# Patient Record
Sex: Female | Born: 1960 | Hispanic: No | Marital: Married | Smoking: Never smoker
Health system: Southern US, Community
[De-identification: ages and names within clinical notes are randomized; demographics above are authoritative.]

## PROBLEM LIST (undated history)

## (undated) DIAGNOSIS — E785 Hyperlipidemia, unspecified: Secondary | ICD-10-CM

## (undated) DIAGNOSIS — E349 Endocrine disorder, unspecified: Secondary | ICD-10-CM

## (undated) HISTORY — PX: OTHER SURGICAL HISTORY: SHX169

## (undated) HISTORY — DX: Endocrine disorder, unspecified: E34.9

---

## 1999-03-18 ENCOUNTER — Ambulatory Visit (HOSPITAL_COMMUNITY): Admission: RE | Admit: 1999-03-18 | Discharge: 1999-03-18 | Payer: Self-pay | Admitting: Family Medicine

## 1999-03-18 ENCOUNTER — Encounter: Payer: Self-pay | Admitting: Family Medicine

## 1999-09-02 ENCOUNTER — Encounter (INDEPENDENT_AMBULATORY_CARE_PROVIDER_SITE_OTHER): Payer: Self-pay | Admitting: Specialist

## 1999-09-02 ENCOUNTER — Other Ambulatory Visit: Admission: RE | Admit: 1999-09-02 | Discharge: 1999-09-02 | Payer: Self-pay | Admitting: Gynecology

## 2000-02-06 ENCOUNTER — Inpatient Hospital Stay (HOSPITAL_COMMUNITY): Admission: AD | Admit: 2000-02-06 | Discharge: 2000-02-08 | Payer: Self-pay | Admitting: Gynecology

## 2000-02-09 ENCOUNTER — Encounter: Admission: RE | Admit: 2000-02-09 | Discharge: 2000-03-10 | Payer: Self-pay | Admitting: Gynecology

## 2000-04-27 ENCOUNTER — Encounter: Admission: RE | Admit: 2000-04-27 | Discharge: 2000-07-26 | Payer: Self-pay | Admitting: Gynecology

## 2001-05-20 ENCOUNTER — Other Ambulatory Visit: Admission: RE | Admit: 2001-05-20 | Discharge: 2001-05-20 | Payer: Self-pay | Admitting: Gynecology

## 2002-05-20 ENCOUNTER — Other Ambulatory Visit: Admission: RE | Admit: 2002-05-20 | Discharge: 2002-05-20 | Payer: Self-pay | Admitting: Gynecology

## 2003-05-25 ENCOUNTER — Other Ambulatory Visit: Admission: RE | Admit: 2003-05-25 | Discharge: 2003-05-25 | Payer: Self-pay | Admitting: Gynecology

## 2004-05-28 ENCOUNTER — Other Ambulatory Visit: Admission: RE | Admit: 2004-05-28 | Discharge: 2004-05-28 | Payer: Self-pay | Admitting: Gynecology

## 2005-06-13 ENCOUNTER — Other Ambulatory Visit: Admission: RE | Admit: 2005-06-13 | Discharge: 2005-06-13 | Payer: Self-pay | Admitting: Gynecology

## 2006-06-16 ENCOUNTER — Other Ambulatory Visit: Admission: RE | Admit: 2006-06-16 | Discharge: 2006-06-16 | Payer: Self-pay | Admitting: Gynecology

## 2007-06-21 ENCOUNTER — Other Ambulatory Visit: Admission: RE | Admit: 2007-06-21 | Discharge: 2007-06-21 | Payer: Self-pay | Admitting: Gynecology

## 2008-06-29 ENCOUNTER — Encounter: Admission: RE | Admit: 2008-06-29 | Discharge: 2008-06-29 | Payer: Self-pay | Admitting: Family Medicine

## 2008-07-03 ENCOUNTER — Encounter: Payer: Self-pay | Admitting: Gynecology

## 2008-07-03 ENCOUNTER — Other Ambulatory Visit: Admission: RE | Admit: 2008-07-03 | Discharge: 2008-07-03 | Payer: Self-pay | Admitting: Gynecology

## 2008-07-03 ENCOUNTER — Ambulatory Visit: Payer: Self-pay | Admitting: Gynecology

## 2009-07-04 ENCOUNTER — Ambulatory Visit: Payer: Self-pay | Admitting: Gynecology

## 2009-07-04 ENCOUNTER — Other Ambulatory Visit: Admission: RE | Admit: 2009-07-04 | Discharge: 2009-07-04 | Payer: Self-pay | Admitting: Gynecology

## 2010-03-31 HISTORY — PX: COLONOSCOPY: SHX174

## 2010-06-21 ENCOUNTER — Other Ambulatory Visit: Payer: Self-pay | Admitting: Gastroenterology

## 2010-07-09 ENCOUNTER — Other Ambulatory Visit (HOSPITAL_COMMUNITY)
Admission: RE | Admit: 2010-07-09 | Discharge: 2010-07-09 | Disposition: A | Discharge status: Home / Self Care | Payer: BLUE CROSS/BLUE SHIELD | Source: Ambulatory Visit | Attending: Gynecology | Admitting: Gynecology

## 2010-07-09 ENCOUNTER — Encounter: Payer: Self-pay | Admitting: Gynecology

## 2010-07-09 ENCOUNTER — Encounter (INDEPENDENT_AMBULATORY_CARE_PROVIDER_SITE_OTHER): Payer: BLUE CROSS/BLUE SHIELD | Admitting: Gynecology

## 2010-07-09 ENCOUNTER — Other Ambulatory Visit: Payer: Self-pay | Admitting: Gynecology

## 2010-07-09 DIAGNOSIS — Z124 Encounter for screening for malignant neoplasm of cervix: Secondary | ICD-10-CM | POA: Insufficient documentation

## 2010-07-09 DIAGNOSIS — Z30431 Encounter for routine checking of intrauterine contraceptive device: Secondary | ICD-10-CM

## 2010-07-09 DIAGNOSIS — Z833 Family history of diabetes mellitus: Secondary | ICD-10-CM

## 2010-07-09 DIAGNOSIS — Z01419 Encounter for gynecological examination (general) (routine) without abnormal findings: Secondary | ICD-10-CM

## 2010-07-09 DIAGNOSIS — Z13220 Encounter for screening for lipoid disorders: Secondary | ICD-10-CM

## 2010-08-16 NOTE — Discharge Summary (Signed)
[facility]  Patient:    Debbie Decker, Debbie Decker                          MRN: [id number] Adm. Date:  [id number] Disc. Date: [id number] Attending:  Tonye Royalty Dictator:   Antony Contras, [facility]                           Discharge Summary  DISCHARGE DIAGNOSES:          1. Intrauterine pregnancy, at 37 weeks.                               2. History of advanced maternal age.                               3. Positive group B streptococcus status.                               4. History of an endometrial polyp, which                                  was excised in the first trimester.  PROCEDURES:                   Low forceps assisted vaginal delivery of a viable infant over intact perineum with a second degree midline laceration.  HISTORY OF PRESENT ILLNESS:   The patient is a 50 year old, gravida 4, para 0-0-3-0 with an LMP of May 29, 1999, [facility] of February 27, 2000.  Prenatal risk factors include advanced maternal age, excision of endometrial polyp in the first trimester, and positive GBS status.  PRENATAL LABORATORY DATA:     Prenatal labs were as follows:  Blood type O positive.  Antibody screen negative.  RPR.  HBsAg nonreactive. Rubella-immune.  MSAFP and genetic amniocentesis normal.  GBS positive.  HOSPITAL COURSE AND TREATMENT:                    The patient was admitted at 37 weeks with spontaneous rupture of membranes and uterine contractions.  The cervix was 4 cm, 80%, -1 to 2 station.  Labor did progress to complete dilatation.  She did sustain some variable decelerations with pushing.  Delivery was accomplished per assistance with low forceps, Apgars 9 and 9, female infant, weighing 6 pounds 6 ounces over intact perineum with a second degree midline laceration.  Postoperative course:  She remained afebrile, had no difficulty voiding, was able to be discharged in stable condition on her second postpartum day.  CBC: Hematocrit  29.1, hemoglobin 10.4, WBC 13.5, platelets 196.  DISPOSITION:                  Followup in the office in six weeks.  Continue with prenatal vitamins and iron.  Motrin and Tylox for pain.  DD:  03/02/00 TD:  03/02/00 Job: [id number] XL/[id number]

## 2011-06-10 ENCOUNTER — Encounter: Payer: Self-pay | Admitting: Gynecology

## 2011-07-10 ENCOUNTER — Encounter: Payer: Self-pay | Admitting: Gynecology

## 2011-07-11 ENCOUNTER — Encounter: Payer: Self-pay | Admitting: Gynecology

## 2011-07-11 ENCOUNTER — Ambulatory Visit (INDEPENDENT_AMBULATORY_CARE_PROVIDER_SITE_OTHER): Payer: 59 | Admitting: Gynecology

## 2011-07-11 VITALS — BP 112/78 | Ht 59.75 in | Wt 106.0 lb

## 2011-07-11 DIAGNOSIS — Z30431 Encounter for routine checking of intrauterine contraceptive device: Secondary | ICD-10-CM

## 2011-07-11 DIAGNOSIS — Z01419 Encounter for gynecological examination (general) (routine) without abnormal findings: Secondary | ICD-10-CM

## 2011-07-11 NOTE — Patient Instructions (Signed)
Follow up for [facility] result. Otherwise follow up in one year for annual gynecologic exam

## 2011-07-11 NOTE — Progress Notes (Signed)
Debbie Decker [birth year] [id number]        51 y.o.  for annual exam.  Doing well no complaints.  Past medical history,surgical history, medications, allergies, family history and social history were all reviewed and documented in the EPIC chart. ROS:  Was performed and pertinent positives and negatives are included in the history.  Exam: Elane Fritz chaperone present Filed Vitals:   07/11/11 0925  BP: 112/78   General appearance  Normal Skin grossly normal Head/Neck normal with no cervical or supraclavicular adenopathy thyroid normal Lungs  clear Cardiac RR, without RMG Abdominal  soft, nontender, without masses, organomegaly or hernia Breasts  examined lying and sitting without masses, retractions, discharge or axillary adenopathy. Pelvic  Ext/BUS/vagina  normal   Cervix  normal  IUD string visualized  Uterus  retroverted, normal size, shape and contour, midline and mobile nontender   Adnexa  Without masses or tenderness    Anus and perineum  normal   Rectovaginal  normal sphincter tone without palpated masses or tenderness. External hemorrhoids noted.   Assessment/Plan:  51 y.o. female for annual exam.    1. Mirena IUD. Placed March 2009. Having light menses. No hot flushes night sweats or other symptoms. We'll check baseline FSH. 2. Mammogram. Patient had mammography in March 2013. She'll continue with annual mammography. SBE monthly reviewed. 3. Colonoscopy. Patient had her colonoscopy 2012. We'll follow up with her recommended screening interval. 4. Pap smear. No Pap smear was done today. Last Pap smear 2012. Patient has no history of abnormal Pap smears with multiple normal reports in her chart. We'll plan every 3 years screen per current screening guidelines. 5. Bone health. Increase calcium vitamin D. We'll plan DEXA in several years after transition through menopause. 6. Health maintenance. No blood work was done today as it was all recently done through her primary physician's  office. Assuming she continues well from a gynecologic standpoint she will see [place] in a year, sooner as needed    Dara Lords MD, 9:54 AM 07/11/2011

## 2011-07-12 LAB — FOLLICLE STIMULATING HORMONE: FSH: 58.3 m[IU]/mL

## 2011-07-14 ENCOUNTER — Encounter: Payer: Self-pay | Admitting: Gynecology

## 2012-06-24 ENCOUNTER — Encounter: Payer: Self-pay | Admitting: Gynecology

## 2012-07-20 ENCOUNTER — Encounter: Payer: Self-pay | Admitting: Gynecology

## 2012-07-20 ENCOUNTER — Ambulatory Visit (INDEPENDENT_AMBULATORY_CARE_PROVIDER_SITE_OTHER): Payer: 59 | Admitting: Gynecology

## 2012-07-20 VITALS — BP 124/74 | Ht 60.0 in | Wt 106.0 lb

## 2012-07-20 DIAGNOSIS — Z30431 Encounter for routine checking of intrauterine contraceptive device: Secondary | ICD-10-CM

## 2012-07-20 DIAGNOSIS — Z30432 Encounter for removal of intrauterine contraceptive device: Secondary | ICD-10-CM

## 2012-07-20 DIAGNOSIS — Z01419 Encounter for gynecological examination (general) (routine) without abnormal findings: Secondary | ICD-10-CM

## 2012-07-20 DIAGNOSIS — K649 Unspecified hemorrhoids: Secondary | ICD-10-CM

## 2012-07-20 NOTE — Progress Notes (Signed)
Debbie Decker 1961-01-07 [id number]        52 y.o.  [id number] for annual exam.  Doing well. Several issues noted below.  Past medical history,surgical history, medications, allergies, family history and social history were all reviewed and documented in the EPIC chart. ROS:  Was performed and pertinent positives and negatives are included in the history.  Exam: Kim assistant Filed Vitals:   07/20/12 1018  BP: 124/74  Height: 5' (1.524 m)  Weight: 106 lb (48.081 kg)   General appearance  Normal Skin grossly normal Head/Neck normal with no cervical or supraclavicular adenopathy thyroid normal Lungs  clear Cardiac RR, without RMG Abdominal  soft, nontender, without masses, organomegaly or hernia Breasts  examined lying and sitting without masses, retractions, discharge or axillary adenopathy. Pelvic  Ext/BUS/vagina  normal   Cervix  normal with IUD string visualized  Uterus  anteverted, normal size, shape and contour, midline and mobile nontender   Adnexa  Without masses or tenderness    Anus and perineum  normal   Rectovaginal  normal sphincter tone without palpated masses or tenderness. Old external hemorrhoids   Assessment/Plan:  52 y.o. [id number] female for annual exam.   1. Mirena IUD 05/2007. Due to be removed now. Is amenorrheic with occasional hot flashes. Had [facility] of 58 last year. Mirena IUD string visualized, grasped with a [place] forcep and her Mirena IUD was removed, shown to her and discarded. At this point she will keep symptoms/menstrual calendar. Assuming no further bleeding will monitor. Resumes vaginal bleeding then she'll represent for further evaluation. We'll keep menopausal symptom calendar and as long as acceptable then we'll follow. OTC soy-based product options reviewed as well as HRT. Patient will represent if her symptoms become unacceptable. The issue of contraception was also reviewed and options for barrier contraception now until we were assured she remains  amenorrheic was also discussed. 2. External hemorrhoids. Has had for a number of years. Not overly bothersome to the patient she'll continue to monitor. 3. Mammography 05/2012. Continue with annual mammography. SBE monthly reviewed. 4. Pap smear 2012. No Pap smear done today. No history of abnormal Pap smears previously. Plan repeat Pap smear next year a 3 year interval. 5. Colonoscopy 2012. Followup at their recommended interval. 6. Health maintenance. Patient's making an appointment to see her primary physician Dr. Tiburcio Pea. She does note some fatigue. Discussed that that may be related to the menopause but also needs to have her thyroid and hemoglobin checked. Offered today but she declined and said she can followup with him to do all of her routine blood work. Followup one year, sooner as needed.    Dara Lords MD, 10:37 AM 07/20/2012

## 2012-07-20 NOTE — Patient Instructions (Signed)
In any vaginal bleeding followup for evaluation. Followup if menopausal symptoms worsened and you want to discuss hormone replacement. Followup with Dr. Tiburcio Pea in reference to general health maintenance and your fatigue. Followup here in one year if you continue well.

## 2012-07-21 LAB — URINALYSIS W MICROSCOPIC + REFLEX CULTURE
Casts: NONE SEEN
Hgb urine dipstick: NEGATIVE
Leukocytes, UA: NEGATIVE
Nitrite: NEGATIVE
Specific Gravity, Urine: 1.016 (ref 1.005–1.030)
pH: 7.5 (ref 5.0–8.0)

## 2013-06-29 ENCOUNTER — Encounter: Payer: Self-pay | Admitting: Gynecology

## 2013-07-26 ENCOUNTER — Encounter: Payer: Self-pay | Admitting: Gynecology

## 2013-07-26 ENCOUNTER — Other Ambulatory Visit (HOSPITAL_COMMUNITY)
Admission: RE | Admit: 2013-07-26 | Discharge: 2013-07-26 | Disposition: A | Discharge status: Home / Self Care | Payer: 59 | Source: Ambulatory Visit | Attending: Gynecology | Admitting: Gynecology

## 2013-07-26 ENCOUNTER — Ambulatory Visit (INDEPENDENT_AMBULATORY_CARE_PROVIDER_SITE_OTHER): Payer: 59 | Admitting: Gynecology

## 2013-07-26 VITALS — BP 120/76 | Ht 60.0 in | Wt 105.0 lb

## 2013-07-26 DIAGNOSIS — Z1151 Encounter for screening for human papillomavirus (HPV): Secondary | ICD-10-CM | POA: Insufficient documentation

## 2013-07-26 DIAGNOSIS — Z01419 Encounter for gynecological examination (general) (routine) without abnormal findings: Secondary | ICD-10-CM | POA: Insufficient documentation

## 2013-07-26 DIAGNOSIS — N952 Postmenopausal atrophic vaginitis: Secondary | ICD-10-CM

## 2013-07-26 DIAGNOSIS — K649 Unspecified hemorrhoids: Secondary | ICD-10-CM

## 2013-07-26 NOTE — Patient Instructions (Signed)
Followup in one year, sooner if vaginal issues become a problem.  Health Maintenance, Female A healthy lifestyle and preventative care can promote health and wellness.  Maintain regular health, dental, and eye exams.  Eat a healthy diet. Foods like vegetables, fruits, whole grains, low-fat dairy products, and lean protein foods contain the nutrients you need without too many calories. Decrease your intake of foods high in solid fats, added sugars, and salt. Get information about a proper diet from your caregiver, if necessary.  Regular physical exercise is one of the most important things you can do for your health. Most adults should get at least 150 minutes of moderate-intensity exercise (any activity that increases your heart rate and causes you to sweat) each week. In addition, most adults need muscle-strengthening exercises on 2 or more days a week.   Maintain a healthy weight. The body mass index (BMI) is a screening tool to identify possible weight problems. It provides an estimate of body fat based on height and weight. Your caregiver can help determine your BMI, and can help you achieve or maintain a healthy weight. For adults 20 years and older:  A BMI below 18.5 is considered underweight.  A BMI of 18.5 to 24.9 is normal.  A BMI of 25 to 29.9 is considered overweight.  A BMI of 30 and above is considered obese.  Maintain normal blood lipids and cholesterol by exercising and minimizing your intake of saturated fat. Eat a balanced diet with plenty of fruits and vegetables. Blood tests for lipids and cholesterol should begin at age 29 and be repeated every 5 years. If your lipid or cholesterol levels are high, you are over 50, or you are a high risk for heart disease, you may need your cholesterol levels checked more frequently.Ongoing high lipid and cholesterol levels should be treated with medicines if diet and exercise are not effective.  If you smoke, find out from your caregiver  how to quit. If you do not use tobacco, do not start.  Lung cancer screening is recommended for adults aged 45 80 years who are at high risk for developing lung cancer because of a history of smoking. Yearly low-dose computed tomography (CT) is recommended for people who have at least a 30-pack-year history of smoking and are a current smoker or have quit within the past 15 years. A pack year of smoking is smoking an average of 1 pack of cigarettes a day for 1 year (for example: 1 pack a day for 30 years or 2 packs a day for 15 years). Yearly screening should continue until the smoker has stopped smoking for at least 15 years. Yearly screening should also be stopped for people who develop a health problem that would prevent them from having lung cancer treatment.  If you are pregnant, do not drink alcohol. If you are breastfeeding, be very cautious about drinking alcohol. If you are not pregnant and choose to drink alcohol, do not exceed 1 drink per day. One drink is considered to be 12 ounces (355 mL) of beer, 5 ounces (148 mL) of wine, or 1.5 ounces (44 mL) of liquor.  Avoid use of street drugs. Do not share needles with anyone. Ask for help if you need support or instructions about stopping the use of drugs.  High blood pressure causes heart disease and increases the risk of stroke. Blood pressure should be checked at least every 1 to 2 years. Ongoing high blood pressure should be treated with medicines, if weight  loss and exercise are not effective.  If you are 55 to 55 years old, ask your caregiver if you should take aspirin to prevent strokes.  Diabetes screening involves taking a blood sample to check your fasting blood sugar level. This should be done once every 3 years, after age 45, if you are within normal weight and without risk factors for diabetes. Testing should be considered at a younger age or be carried out more frequently if you are overweight and have at least 1 risk factor for  diabetes.  Breast cancer screening is essential preventative care for women. You should practice "breast self-awareness." This means understanding the normal appearance and feel of your breasts and may include breast self-examination. Any changes detected, no matter how small, should be reported to a caregiver. Women in their 20s and 30s should have a clinical breast exam (CBE) by a caregiver as part of a regular health exam every 1 to 3 years. After age 40, women should have a CBE every year. Starting at age 40, women should consider having a mammogram (breast X-ray) every year. Women who have a family history of breast cancer should talk to their caregiver about genetic screening. Women at a high risk of breast cancer should talk to their caregiver about having an MRI and a mammogram every year.  Breast cancer gene (BRCA)-related cancer risk assessment is recommended for women who have family members with BRCA-related cancers. BRCA-related cancers include breast, ovarian, tubal, and peritoneal cancers. Having family members with these cancers may be associated with an increased risk for harmful changes (mutations) in the breast cancer genes BRCA1 and BRCA2. Results of the assessment will determine the need for genetic counseling and BRCA1 and BRCA2 testing.  The Pap test is a screening test for cervical cancer. Women should have a Pap test starting at age 21. Between ages 21 and 29, Pap tests should be repeated every 2 years. Beginning at age 30, you should have a Pap test every 3 years as long as the past 3 Pap tests have been normal. If you had a hysterectomy for a problem that was not cancer or a condition that could lead to cancer, then you no longer need Pap tests. If you are between ages 65 and 70, and you have had normal Pap tests going back 10 years, you no longer need Pap tests. If you have had past treatment for cervical cancer or a condition that could lead to cancer, you need Pap tests and  screening for cancer for at least 20 years after your treatment. If Pap tests have been discontinued, risk factors (such as a new sexual partner) need to be reassessed to determine if screening should be resumed. Some women have medical problems that increase the chance of getting cervical cancer. In these cases, your caregiver may recommend more frequent screening and Pap tests.  The human papillomavirus (HPV) test is an additional test that may be used for cervical cancer screening. The HPV test looks for the virus that can cause the cell changes on the cervix. The cells collected during the Pap test can be tested for HPV. The HPV test could be used to screen women aged 30 years and older, and should be used in women of any age who have unclear Pap test results. After the age of 30, women should have HPV testing at the same frequency as a Pap test.  Colorectal cancer can be detected and often prevented. Most routine colorectal cancer screening begins at   the age of 50 and continues through age 75. However, your caregiver may recommend screening at an earlier age if you have risk factors for colon cancer. On a yearly basis, your caregiver may provide home test kits to check for hidden blood in the stool. Use of a small camera at the end of a tube, to directly examine the colon (sigmoidoscopy or colonoscopy), can detect the earliest forms of colorectal cancer. Talk to your caregiver about this at age 50, when routine screening begins. Direct examination of the colon should be repeated every 5 to 10 years through age 75, unless early forms of pre-cancerous polyps or small growths are found.  Hepatitis C blood testing is recommended for all people born from 1945 through 1965 and any individual with known risks for hepatitis C.  Practice safe sex. Use condoms and avoid high-risk sexual practices to reduce the spread of sexually transmitted infections (STIs). Sexually active women aged 25 and younger should be  checked for Chlamydia, which is a common sexually transmitted infection. Older women with new or multiple partners should also be tested for Chlamydia. Testing for other STIs is recommended if you are sexually active and at increased risk.  Osteoporosis is a disease in which the bones lose minerals and strength with aging. This can result in serious bone fractures. The risk of osteoporosis can be identified using a bone density scan. Women ages 65 and over and women at risk for fractures or osteoporosis should discuss screening with their caregivers. Ask your caregiver whether you should be taking a calcium supplement or vitamin D to reduce the rate of osteoporosis.  Menopause can be associated with physical symptoms and risks. Hormone replacement therapy is available to decrease symptoms and risks. You should talk to your caregiver about whether hormone replacement therapy is right for you.  Use sunscreen. Apply sunscreen liberally and repeatedly throughout the day. You should seek shade when your shadow is shorter than you. Protect yourself by wearing long sleeves, pants, a wide-brimmed hat, and sunglasses year round, whenever you are outdoors.  Notify your caregiver of new moles or changes in moles, especially if there is a change in shape or color. Also notify your caregiver if a mole is larger than the size of a pencil eraser.  Stay current with your immunizations. Document Released: 09/30/2010 Document Revised: 07/12/2012 Document Reviewed: 09/30/2010 [employer] Patient Information 2014 [employer].  

## 2013-07-26 NOTE — Progress Notes (Signed)
Debbie Decker 10/21/1960 [id number]        53 y.o.  [id number] for annual exam.  Several issues noted below.  Past medical history,surgical history, problem list, medications, allergies, family history and social history were all reviewed and documented as reviewed in the EPIC chart.  ROS:  12 system ROS performed with pertinent positives and negatives included in the history, assessment and plan.  Included Systems: General, HEENT, Neck, Cardiovascular, Pulmonary, Gastrointestinal, Genitourinary, Musculoskeletal, Dermatologic, Endocrine, Hematological, Neurologic, Psychiatric Additional significant findings : Vaginal dryness   Exam: Debbie Decker assistant Filed Vitals:   07/26/13 1555  BP: 120/76  Height: 5' (1.524 m)  Weight: 105 lb (47.628 kg)   General appearance:  Normal affect, orientation and appearance. Skin: Grossly normal HEENT: Without gross lesions.  No cervical or supraclavicular adenopathy. Thyroid normal.  Lungs:  Clear without wheezing, rales or rhonchi Cardiac: RR, without RMG Abdominal:  Soft, nontender, without masses, guarding, rebound, organomegaly or hernia Breasts:  Examined lying and sitting without masses, retractions, discharge or axillary adenopathy. Pelvic:  Ext/BUS/vagina with generalized atrophic changes  Cervix with atrophic changes  Uterus anteverted, normal size, shape and contour, midline and mobile nontender   Adnexa  Without masses or tenderness    Anus and perineum  Normal with large external hemorrhoids  Rectovaginal  Normal sphincter tone without palpated masses or tenderness.    Assessment/Plan:  53 y.o. [id number] female for annual exam.   1. Postmenopausal/atrophic genital changes. Patient remains amenorrheic since removing her IUD last year. Not having significant hot flashes or night sweats. He is having some vaginal dryness and dyspareunia. No vaginal bleeding. Options for management include OTC products, vaginal estrogen cream, Vagifem and Osphena  reviewed. The pros/cons, risks/benefits of these choices discussed. Issues of absorption and systemic risks also discussed. Patient not interested in trying any prescription medication. Prefers monitoring at present. Will call if she decides she wants to try medication. 2. External hemorrhoids. Present times years without significant symptoms. Prefers just to monitor. 3. Pap smear 2012. Pap/HPV today. No history of abnormal Pap smears. Repeat at 3-5 year interval per current screening guidelines. 4. Mammography 05/2013. Continue with annual mammography. S/P mouth reviewed. 5. DEXA never. Will plan further into the menopause. Increase calcium vitamin D reviewed. 6. Colonoscopy 3 years ago. Repeat at their recommended interval. 7. Health maintenance. No routine blood work done and she reports this all done through Dr. Tiburcio PeaHarris office. Followup one year, sooner as needed.   Note: This document was prepared with digital dictation and possible smart phrase technology. Any transcriptional errors that result from this process are unintentional.   Debbie Lordsimothy P Runa Whittingham MD, 4:43 PM 07/26/2013

## 2014-01-30 ENCOUNTER — Encounter: Payer: Self-pay | Admitting: Gynecology

## 2014-07-06 ENCOUNTER — Encounter: Payer: Self-pay | Admitting: Gynecology

## 2014-08-01 ENCOUNTER — Encounter: Payer: Self-pay | Admitting: Gynecology

## 2014-08-01 ENCOUNTER — Ambulatory Visit (INDEPENDENT_AMBULATORY_CARE_PROVIDER_SITE_OTHER): Payer: 59 | Admitting: Gynecology

## 2014-08-01 VITALS — BP 130/84 | Ht 60.0 in | Wt 104.0 lb

## 2014-08-01 DIAGNOSIS — Z01419 Encounter for gynecological examination (general) (routine) without abnormal findings: Secondary | ICD-10-CM

## 2014-08-01 DIAGNOSIS — N952 Postmenopausal atrophic vaginitis: Secondary | ICD-10-CM

## 2014-08-01 DIAGNOSIS — K649 Unspecified hemorrhoids: Secondary | ICD-10-CM | POA: Diagnosis not present

## 2014-08-01 NOTE — Patient Instructions (Signed)
You may obtain a copy of any labs that were done today by logging onto MyChart as outlined in the instructions provided with your AVS (after visit summary). The office will not call with normal lab results but certainly if there are any significant abnormalities then we will contact you.   Health Maintenance, Female A healthy lifestyle and preventative care can promote health and wellness.  Maintain regular health, dental, and eye exams.  Eat a healthy diet. Foods like vegetables, fruits, whole grains, low-fat dairy products, and lean protein foods contain the nutrients you need without too many calories. Decrease your intake of foods high in solid fats, added sugars, and salt. Get information about a proper diet from your caregiver, if necessary.  Regular physical exercise is one of the most important things you can do for your health. Most adults should get at least 150 minutes of moderate-intensity exercise (any activity that increases your heart rate and causes you to sweat) each week. In addition, most adults need muscle-strengthening exercises on 2 or more days a week.   Maintain a healthy weight. The body mass index (BMI) is a screening tool to identify possible weight problems. It provides an estimate of body fat based on height and weight. Your caregiver can help determine your BMI, and can help you achieve or maintain a healthy weight. For adults 20 years and older:  A BMI below 18.5 is considered underweight.  A BMI of 18.5 to 24.9 is normal.  A BMI of 25 to 29.9 is considered overweight.  A BMI of 30 and above is considered obese.  Maintain normal blood lipids and cholesterol by exercising and minimizing your intake of saturated fat. Eat a balanced diet with plenty of fruits and vegetables. Blood tests for lipids and cholesterol should begin at age 61 and be repeated every 5 years. If your lipid or cholesterol levels are high, you are over 50, or you are a high risk for heart  disease, you may need your cholesterol levels checked more frequently.Ongoing high lipid and cholesterol levels should be treated with medicines if diet and exercise are not effective.  If you smoke, find out from your caregiver how to quit. If you do not use tobacco, do not start.  Lung cancer screening is recommended for adults aged 33 80 years who are at high risk for developing lung cancer because of a history of smoking. Yearly low-dose computed tomography (CT) is recommended for people who have at least a 30-pack-year history of smoking and are a current smoker or have quit within the past 15 years. A pack year of smoking is smoking an average of 1 pack of cigarettes a day for 1 year (for example: 1 pack a day for 30 years or 2 packs a day for 15 years). Yearly screening should continue until the smoker has stopped smoking for at least 15 years. Yearly screening should also be stopped for people who develop a health problem that would prevent them from having lung cancer treatment.  If you are pregnant, do not drink alcohol. If you are breastfeeding, be very cautious about drinking alcohol. If you are not pregnant and choose to drink alcohol, do not exceed 1 drink per day. One drink is considered to be 12 ounces (355 mL) of beer, 5 ounces (148 mL) of wine, or 1.5 ounces (44 mL) of liquor.  Avoid use of street drugs. Do not share needles with anyone. Ask for help if you need support or instructions about stopping  the use of drugs.  High blood pressure causes heart disease and increases the risk of stroke. Blood pressure should be checked at least every 1 to 2 years. Ongoing high blood pressure should be treated with medicines, if weight loss and exercise are not effective.  If you are 59 to 56 years old, ask your caregiver if you should take aspirin to prevent strokes.  Diabetes screening involves taking a blood sample to check your fasting blood sugar level. This should be done once every 3  years, after age 91, if you are within normal weight and without risk factors for diabetes. Testing should be considered at a younger age or be carried out more frequently if you are overweight and have at least 1 risk factor for diabetes.  Breast cancer screening is essential preventative care for women. You should practice "breast self-awareness." This means understanding the normal appearance and feel of your breasts and may include breast self-examination. Any changes detected, no matter how small, should be reported to a caregiver. Women in their 66s and 30s should have a clinical breast exam (CBE) by a caregiver as part of a regular health exam every 1 to 3 years. After age 101, women should have a CBE every year. Starting at age 100, women should consider having a mammogram (breast X-ray) every year. Women who have a family history of breast cancer should talk to their caregiver about genetic screening. Women at a high risk of breast cancer should talk to their caregiver about having an MRI and a mammogram every year.  Breast cancer gene (BRCA)-related cancer risk assessment is recommended for women who have family members with BRCA-related cancers. BRCA-related cancers include breast, ovarian, tubal, and peritoneal cancers. Having family members with these cancers may be associated with an increased risk for harmful changes (mutations) in the breast cancer genes BRCA1 and BRCA2. Results of the assessment will determine the need for genetic counseling and BRCA1 and BRCA2 testing.  The Pap test is a screening test for cervical cancer. Women should have a Pap test starting at age 57. Between ages 25 and 35, Pap tests should be repeated every 2 years. Beginning at age 37, you should have a Pap test every 3 years as long as the past 3 Pap tests have been normal. If you had a hysterectomy for a problem that was not cancer or a condition that could lead to cancer, then you no longer need Pap tests. If you are  between ages 50 and 76, and you have had normal Pap tests going back 10 years, you no longer need Pap tests. If you have had past treatment for cervical cancer or a condition that could lead to cancer, you need Pap tests and screening for cancer for at least 20 years after your treatment. If Pap tests have been discontinued, risk factors (such as a new sexual partner) need to be reassessed to determine if screening should be resumed. Some women have medical problems that increase the chance of getting cervical cancer. In these cases, your caregiver may recommend more frequent screening and Pap tests.  The human papillomavirus (HPV) test is an additional test that may be used for cervical cancer screening. The HPV test looks for the virus that can cause the cell changes on the cervix. The cells collected during the Pap test can be tested for HPV. The HPV test could be used to screen women aged 44 years and older, and should be used in women of any age  who have unclear Pap test results. After the age of 55, women should have HPV testing at the same frequency as a Pap test.  Colorectal cancer can be detected and often prevented. Most routine colorectal cancer screening begins at the age of 44 and continues through age 20. However, your caregiver may recommend screening at an earlier age if you have risk factors for colon cancer. On a yearly basis, your caregiver may provide home test kits to check for hidden blood in the stool. Use of a small camera at the end of a tube, to directly examine the colon (sigmoidoscopy or colonoscopy), can detect the earliest forms of colorectal cancer. Talk to your caregiver about this at age 86, when routine screening begins. Direct examination of the colon should be repeated every 5 to 10 years through age 13, unless early forms of pre-cancerous polyps or small growths are found.  Hepatitis C blood testing is recommended for all people born from [birth year] through 1967 and any  individual with known risks for hepatitis C.  Practice safe sex. Use condoms and avoid high-risk sexual practices to reduce the spread of sexually transmitted infections (STIs). Sexually active women aged 36 and younger should be checked for Chlamydia, which is a common sexually transmitted infection. Older women with new or multiple partners should also be tested for Chlamydia. Testing for other STIs is recommended if you are sexually active and at increased risk.  Osteoporosis is a disease in which the bones lose minerals and strength with aging. This can result in serious bone fractures. The risk of osteoporosis can be identified using a bone density scan. Women ages 20 and over and women at risk for fractures or osteoporosis should discuss screening with their caregivers. Ask your caregiver whether you should be taking a calcium supplement or vitamin D to reduce the rate of osteoporosis.  Menopause can be associated with physical symptoms and risks. Hormone replacement therapy is available to decrease symptoms and risks. You should talk to your caregiver about whether hormone replacement therapy is right for you.  Use sunscreen. Apply sunscreen liberally and repeatedly throughout the day. You should seek shade when your shadow is shorter than you. Protect yourself by wearing long sleeves, pants, a wide-brimmed hat, and sunglasses year round, whenever you are outdoors.  Notify your caregiver of new moles or changes in moles, especially if there is a change in shape or color. Also notify your caregiver if a mole is larger than the size of a pencil eraser.  Stay current with your immunizations. Document Released: 09/30/2010 Document Revised: 07/12/2012 Document Reviewed: 09/30/2010 [facility] Patient Information 2014 [employer].

## 2014-08-01 NOTE — Progress Notes (Signed)
Debbie Decker 02-24-1961 [id number]        54 y.o.  [id number] for annual exam.  Several issues noted below.  Past medical history,surgical history, problem list, medications, allergies, family history and social history were all reviewed and documented as reviewed in the EPIC chart.  ROS:  Performed with pertinent positives and negatives included in the history, assessment and plan.   Additional significant findings :  none   Exam: [facility] personassistant Filed Vitals:   08/01/14 1610  BP: 130/84  Height: 5' (1.524 m)  Weight: 104 lb (47.174 kg)   General appearance:  Normal affect, orientation and appearance. Skin: Grossly normal HEENT: Without gross lesions.  No cervical or supraclavicular adenopathy. Thyroid normal.  Lungs:  Clear without wheezing, rales or rhonchi Cardiac: RR, without RMG Abdominal:  Soft, nontender, without masses, guarding, rebound, organomegaly or hernia Breasts:  Examined lying and sitting without masses, retractions, discharge or axillary adenopathy. Pelvic:  Ext/BUS/vagina with atrophic changes  Cervix with atrophic changes  Uterus anteverted, normal size, shape and contour, midline and mobile nontender   Adnexa  Without masses or tenderness    Anus and perineum  Normal with old external hemorrhoids  Rectovaginal  Normal sphincter tone without palpated masses or tenderness.    Assessment/Plan:  54 y.o. [id number] female for annual exam.   1. Postmenopausal/atrophic genital changes. Patient doing well without significant hot flashes or night sweats. Is having issues with dyspareunia. Using OTC lubricants but still noticing some friction irritation. Options for management reviewed to include use of oils, vaginal estrogen support, Osphena. Is not interested in prescription medication. Will try vegetable/olive oil to see if this does not help. Follow up if continues to be a problem. No history of bleeding. Patient knows to report any vaginal bleeding. 2. Pap smear/HPV  negative 06/2013. No Pap smear done today. No history of significant abnormal Pap smears. Plan repeat Pap smear at 3-5 year interval per current screening guidelines. 3. Mammography 06/2014. Continue with annual mammography. SBE monthly reviewed. 4. Colonoscopy 2012. Repeat at their recommended interval. 5. DEXA never. Will plan further into the menopause. Increase calcium vitamin D reviewed. 6. Health maintenance. No routine blood work done as patient reports this done at Dr. Johnathan HausenHarris's office. Follow up in one year, sooner as needed.     Dara LordsFONTAINE,Lisl Slingerland P MD, 4:36 PM 08/01/2014

## 2014-08-02 LAB — URINALYSIS W MICROSCOPIC + REFLEX CULTURE
BILIRUBIN URINE: NEGATIVE
Bacteria, UA: NONE SEEN
CRYSTALS: NONE SEEN
Casts: NONE SEEN
GLUCOSE, UA: NEGATIVE mg/dL
Hgb urine dipstick: NEGATIVE
KETONES UR: NEGATIVE mg/dL
Leukocytes, UA: NEGATIVE
Nitrite: NEGATIVE
PROTEIN: NEGATIVE mg/dL
SPECIFIC GRAVITY, URINE: 1.007 (ref 1.005–1.030)
SQUAMOUS EPITHELIAL / LPF: NONE SEEN
Urobilinogen, UA: 0.2 mg/dL (ref 0.0–1.0)
pH: 7.5 (ref 5.0–8.0)

## 2015-07-17 ENCOUNTER — Encounter: Payer: Self-pay | Admitting: Gynecology

## 2015-08-17 ENCOUNTER — Ambulatory Visit (INDEPENDENT_AMBULATORY_CARE_PROVIDER_SITE_OTHER): Payer: BLUE CROSS/BLUE SHIELD | Admitting: Gynecology

## 2015-08-17 ENCOUNTER — Encounter: Payer: Self-pay | Admitting: Gynecology

## 2015-08-17 VITALS — BP 120/70 | Ht 60.0 in | Wt 103.0 lb

## 2015-08-17 DIAGNOSIS — K640 First degree hemorrhoids: Secondary | ICD-10-CM

## 2015-08-17 DIAGNOSIS — Z01419 Encounter for gynecological examination (general) (routine) without abnormal findings: Secondary | ICD-10-CM

## 2015-08-17 DIAGNOSIS — N952 Postmenopausal atrophic vaginitis: Secondary | ICD-10-CM | POA: Diagnosis not present

## 2015-08-17 MED ORDER — BETAMETHASONE DIPROPIONATE AUG 0.05 % EX CREA: TOPICAL_CREAM | Freq: Two times a day (BID) | CUTANEOUS | Status: DC

## 2015-08-17 NOTE — Progress Notes (Signed)
    Josph MachoLing Ehlert [birth year] [id number]        55 y.o.  [id number]  for annual exam.  Several issues noted below.  Past medical history,surgical history, problem list, medications, allergies, family history and social history were all reviewed and documented as reviewed in the EPIC chart.  ROS:  Performed with pertinent positives and negatives included in the history, assessment and plan.   Additional significant findings :  none   Exam: Kennon PortelaKim [facility] assistant Filed Vitals:   08/17/15 0835  BP: 120/70  Height: 5' (1.524 m)  Weight: 103 lb (46.72 kg)   General appearance:  Normal affect, orientation and appearance. Skin: Grossly normal HEENT: Without gross lesions.  No cervical or supraclavicular adenopathy. Thyroid normal.  Lungs:  Clear without wheezing, rales or rhonchi Cardiac: RR, without RMG Abdominal:  Soft, nontender, without masses, guarding, rebound, organomegaly or hernia Breasts:  Examined lying and sitting without masses, retractions, discharge or axillary adenopathy. Pelvic:  Ext/BUS/vagina with atrophic changes  Cervix with atrophic changes  Uterus anteverted, normal size, shape and contour, midline and mobile nontender   Adnexa without masses or tenderness    Anus and perineum with external hemorrhoids  Rectovaginal normal sphincter tone without palpated masses or tenderness.    Assessment/Plan:  55 y.o. [id number] female for annual exam.   1. Postmenopausal/atrophic genital changes. Patient is having issues with vaginal dryness. No hot flashes or night sweats. No vaginal bleeding. Appears to be both dryness as well as dyspareunia. Reviewed options to include OTC moisturizers and lubricants. Also discussed vaginal estrogen. The issues that needs to be used on a routine basis and the various routes as far as Vagifem, cream either pharmaceutical or formulated. Issues of absorption and risks to include systemic effects such as thrombosis, breast simulation as well as  endometrial stimulation. Patient wants to think about this and will try lubricants for now. May call if she wants to try the estrogen. 2. Hemorrhoids. Patient asked if she could try something for her hemorrhoids. They swell intermittently and cause some discomfort.  History right OTC products. It seems that the swelling is more an issue than anything else. We'll start with the Prolene 0.05% cream twice daily as needed.  Discussed AnaMantle HC or other medications with lidocaine but she's not interested in that at this time. 3. Mammography 06/2015. Continue with annual mammography when due. SBE monthly reviewed. 4. Pap smear/HPV 06/2013 negative. No Pap smear done today. No history of abnormal Pap smears previously. Plan repeat Pap smear at 5 year interval per current screening guidelines. 5. Colonoscopy 2012. Repeat at their recommended interval. 6. Health maintenance. No routine lab work done as patient reports is done at her primary physician's office. In one year, sooner as needed.   Dara LordsFONTAINE,Rishard Delange P MD, 8:57 AM 08/17/2015

## 2015-08-17 NOTE — Patient Instructions (Signed)

## 2016-03-09 DIAGNOSIS — J029 Acute pharyngitis, unspecified: Secondary | ICD-10-CM | POA: Diagnosis not present

## 2016-05-09 DIAGNOSIS — E7800 Pure hypercholesterolemia, unspecified: Secondary | ICD-10-CM | POA: Diagnosis not present

## 2016-05-09 DIAGNOSIS — Z0000 Encounter for general adult medical examination without abnormal findings: Secondary | ICD-10-CM | POA: Diagnosis not present

## 2016-05-09 DIAGNOSIS — Z23 Encounter for immunization: Secondary | ICD-10-CM | POA: Diagnosis not present

## 2016-07-04 ENCOUNTER — Encounter: Payer: Self-pay | Admitting: Gynecology

## 2016-07-04 DIAGNOSIS — Z1231 Encounter for screening mammogram for malignant neoplasm of breast: Secondary | ICD-10-CM | POA: Diagnosis not present

## 2016-08-26 ENCOUNTER — Ambulatory Visit (INDEPENDENT_AMBULATORY_CARE_PROVIDER_SITE_OTHER): Payer: BLUE CROSS/BLUE SHIELD | Admitting: Gynecology

## 2016-08-26 ENCOUNTER — Encounter: Payer: Self-pay | Admitting: Gynecology

## 2016-08-26 VITALS — BP 116/76 | Ht 60.0 in | Wt 106.0 lb

## 2016-08-26 DIAGNOSIS — Z01411 Encounter for gynecological examination (general) (routine) with abnormal findings: Secondary | ICD-10-CM

## 2016-08-26 DIAGNOSIS — N941 Unspecified dyspareunia: Secondary | ICD-10-CM

## 2016-08-26 DIAGNOSIS — N952 Postmenopausal atrophic vaginitis: Secondary | ICD-10-CM

## 2016-08-26 NOTE — Patient Instructions (Signed)
Estradiol vaginal cream...generic  Brand name vaginal estrogen products include Premarin, Estrace, Vagifem

## 2016-08-26 NOTE — Progress Notes (Signed)
    Debbie Decker 10-Jun-1960 [id number]        56 y.o.  [id number] for annual exam.  Patient also complaining of vaginal dryness both on a daily basis as well as with intercourse. Has tried OTC products but still has significant issues. Otherwise not having significant hot flushes or night sweats. No vaginal bleeding.  Past medical history,surgical history, problem list, medications, allergies, family history and social history were all reviewed and documented as reviewed in the EPIC chart.  ROS:  Performed with pertinent positives and negatives included in the history, assessment and plan.   Additional significant findings :  None   Exam: Kennon PortelaKim Gardner assistant Vitals:   08/26/16 1536  BP: 116/76  Weight: 106 lb (48.1 kg)  Height: 5' (1.524 m)   Body mass index is 20.7 kg/m.  General appearance:  Normal affect, orientation and appearance. Skin: Grossly normal HEENT: Without gross lesions.  No cervical or supraclavicular adenopathy. Thyroid normal.  Lungs:  Clear without wheezing, rales or rhonchi Cardiac: RR, without RMG Abdominal:  Soft, nontender, without masses, guarding, rebound, organomegaly or hernia Breasts:  Examined lying and sitting without masses, retractions, discharge or axillary adenopathy. Pelvic:  Ext, BUS, Vagina: With atrophic changes  Cervix: With atrophic changes  Uterus: Anteverted, normal size, shape and contour, midline and mobile nontender   Adnexa: Without masses or tenderness    Anus and perineum: With significant external hemorrhoids  Rectovaginal: Normal sphincter tone without palpated masses or tenderness.    Assessment/Plan:  56 y.o. [id number] female for annual exam.   1. Postmenopausal/atrophic genital changes. Having significant issues with vaginal dryness. I reviewed the options with the patient to include continued use of OTC products, vaginal estrogen products to include brand-name such as Premarin, Estrace and Estring, formulated such as vaginal  estradiol cream, Vagifem tablets, Osphena and the latest [facility] vaginal [facility]. The pros and cons of each choice as well as the risks to include absorption of the estrogen products with systemic effects such as breast stimulation, thrombosis and endometrial stimulation. Issues of Osphena with daily use, no generic and no long-term human data as far as breast and endometrium. Lastly the laser vaginal treatment is being new with no long-term human data as far as long-term success/long-term risks as well as usually not covered by insurance requiring out-of-pocket pay. After a lengthy discussion the patient wants to do some research and will call if she would want to pursue a prescription product. We also discussed the issues of systemic estrogen replacement but in the absence of systemic significant symptoms the patient would prefer a vaginal directed approach. 2. Significant external hemorrhoids. Patient has had for a long time and is treating this of OTC products. 3. Mammography 06/2016. Continue with annual mammography when due. SBE monthly reviewed. 4. Colonoscopy 2012. Reported repeat interval 10 years. 5. Pap smear/HPV 06/2013 negative. No Pap smear done today. No history of significant abnormal Pap smears. Plan repeat Pap smear approaching 5 year interval per current screening guidelines. 6. Health maintenance. No routine lab work done as patient reports this done elsewhere. Follow up with decision as far as atrophic vaginal symptoms and treatment otherwise follow up in one year for annual exam.  Additional time in excess of her routine gynecologic exam was spent in direct face to face counseling and coordination of care in regards to her atrophic vaginal changes and treatment options.    Dara LordsFONTAINE,Ezell Melikian P MD, 4:31 PM 08/26/2016

## 2017-06-02 DIAGNOSIS — J209 Acute bronchitis, unspecified: Secondary | ICD-10-CM | POA: Diagnosis not present

## 2017-07-08 DIAGNOSIS — Z1231 Encounter for screening mammogram for malignant neoplasm of breast: Secondary | ICD-10-CM | POA: Diagnosis not present

## 2017-07-09 DIAGNOSIS — E7800 Pure hypercholesterolemia, unspecified: Secondary | ICD-10-CM | POA: Diagnosis not present

## 2017-07-09 DIAGNOSIS — Z0000 Encounter for general adult medical examination without abnormal findings: Secondary | ICD-10-CM | POA: Diagnosis not present

## 2017-07-30 DIAGNOSIS — J310 Chronic rhinitis: Secondary | ICD-10-CM | POA: Diagnosis not present

## 2017-07-30 DIAGNOSIS — H6983 Other specified disorders of Eustachian tube, bilateral: Secondary | ICD-10-CM | POA: Diagnosis not present

## 2017-08-28 ENCOUNTER — Ambulatory Visit (INDEPENDENT_AMBULATORY_CARE_PROVIDER_SITE_OTHER): Payer: BLUE CROSS/BLUE SHIELD | Admitting: Gynecology

## 2017-08-28 ENCOUNTER — Encounter: Payer: Self-pay | Admitting: Gynecology

## 2017-08-28 VITALS — BP 118/74 | Ht 60.0 in | Wt 107.0 lb

## 2017-08-28 DIAGNOSIS — N952 Postmenopausal atrophic vaginitis: Secondary | ICD-10-CM

## 2017-08-28 DIAGNOSIS — Z1151 Encounter for screening for human papillomavirus (HPV): Secondary | ICD-10-CM | POA: Diagnosis not present

## 2017-08-28 DIAGNOSIS — K644 Residual hemorrhoidal skin tags: Secondary | ICD-10-CM

## 2017-08-28 DIAGNOSIS — Z01419 Encounter for gynecological examination (general) (routine) without abnormal findings: Secondary | ICD-10-CM | POA: Diagnosis not present

## 2017-08-28 NOTE — Progress Notes (Signed)
    Nevena Rozenberg [birth year] [id number]        57 y.o.  [id number] for annual gynecologic exam.  Doing well without gynecologic complaints  Past medical history,surgical history, problem list, medications, allergies, family history and social history were all reviewed and documented as reviewed in the EPIC chart.  ROS:  Performed with pertinent positives and negatives included in the history, assessment and plan.   Additional significant findings : None   Exam: Kennon Portela assistant Vitals:   08/28/17 1509  BP: 118/74  Weight: 107 lb (48.5 kg)  Height: 5' (1.524 m)   Body mass index is 20.9 kg/m.  General appearance:  Normal affect, orientation and appearance. Skin: Grossly normal HEENT: Without gross lesions.  No cervical or supraclavicular adenopathy. Thyroid normal.  Lungs:  Clear without wheezing, rales or rhonchi Cardiac: RR, without RMG Abdominal:  Soft, nontender, without masses, guarding, rebound, organomegaly or hernia Breasts:  Examined lying and sitting without masses, retractions, discharge or axillary adenopathy. Pelvic:  Ext, BUS, Vagina: With atrophic changes  Cervix: With atrophic changes.  Pap smear/HPV  Uterus: Anteverted, normal size, shape and contour, midline and mobile nontender   Adnexa: Without masses or tenderness    Anus and perineum: Normal excepting old external hemorrhoids  Rectovaginal: Normal sphincter tone without palpated masses or tenderness.    Assessment/Plan:  58 y.o. [id number] female for annual gynecologic exam.   1. Postmenopausal/atrophic genital changes.  We discussed last year the issues of vaginal dryness and dyspareunia.  She notes this year she is doing well using OTC products.  Not having significant symptoms such as hot flushes or sweats.  No vaginal bleeding. 2. Pap smear/HPV 06/2013.  Pap smear/HPV today.  No history of significant abnormal Pap smears. 3. Mammography 06/2017.  Continue with annual mammography next year.  Breast exam  normal today. 4. Colonoscopy 2012.  Repeat at their recommended interval. 5. DEXA never.  Discussed baseline study and we will plan next year as she will turn 58. 6. Health maintenance.  No routine lab work done as patient reports this done elsewhere.  Follow-up 1 year, sooner as needed.   Dara Lords MD, 3:40 PM 08/28/2017

## 2017-08-28 NOTE — Patient Instructions (Signed)
Follow-up in 1 year for annual exam, sooner as needed. 

## 2017-08-28 NOTE — Addendum Note (Signed)
Addended by: Dayna Barker on: 08/28/2017 03:45 PM   Modules accepted: Orders

## 2017-08-31 LAB — PAP IG AND HPV HIGH-RISK: HPV DNA HIGH RISK: NOT DETECTED

## 2017-09-18 DIAGNOSIS — H9041 Sensorineural hearing loss, unilateral, right ear, with unrestricted hearing on the contralateral side: Secondary | ICD-10-CM | POA: Diagnosis not present

## 2017-09-18 DIAGNOSIS — H9122 Sudden idiopathic hearing loss, left ear: Secondary | ICD-10-CM | POA: Diagnosis not present

## 2017-09-22 ENCOUNTER — Other Ambulatory Visit: Payer: Self-pay | Admitting: Family Medicine

## 2017-09-22 DIAGNOSIS — H6983 Other specified disorders of Eustachian tube, bilateral: Secondary | ICD-10-CM | POA: Diagnosis not present

## 2017-09-22 DIAGNOSIS — J0101 Acute recurrent maxillary sinusitis: Secondary | ICD-10-CM | POA: Diagnosis not present

## 2017-09-24 ENCOUNTER — Other Ambulatory Visit: Payer: Self-pay | Admitting: Family Medicine

## 2017-09-24 DIAGNOSIS — J0101 Acute recurrent maxillary sinusitis: Secondary | ICD-10-CM

## 2017-09-30 ENCOUNTER — Other Ambulatory Visit: Payer: BLUE CROSS/BLUE SHIELD

## 2017-10-16 ENCOUNTER — Ambulatory Visit
Admission: RE | Admit: 2017-10-16 | Discharge: 2017-10-16 | Disposition: A | Discharge status: Home / Self Care | Payer: BLUE CROSS/BLUE SHIELD | Source: Ambulatory Visit | Attending: Family Medicine | Admitting: Family Medicine

## 2017-10-16 DIAGNOSIS — J0101 Acute recurrent maxillary sinusitis: Secondary | ICD-10-CM

## 2017-12-18 DIAGNOSIS — Z713 Dietary counseling and surveillance: Secondary | ICD-10-CM | POA: Diagnosis not present

## 2018-08-30 ENCOUNTER — Other Ambulatory Visit: Payer: Self-pay

## 2018-08-30 DIAGNOSIS — M8580 Other specified disorders of bone density and structure, unspecified site: Secondary | ICD-10-CM

## 2018-08-30 HISTORY — DX: Other specified disorders of bone density and structure, unspecified site: M85.80

## 2018-08-31 ENCOUNTER — Encounter: Payer: Self-pay | Admitting: Gynecology

## 2018-08-31 ENCOUNTER — Ambulatory Visit (INDEPENDENT_AMBULATORY_CARE_PROVIDER_SITE_OTHER): Payer: BC Managed Care – PPO | Admitting: Gynecology

## 2018-08-31 VITALS — BP 110/70 | Ht 60.0 in | Wt 107.0 lb

## 2018-08-31 DIAGNOSIS — N952 Postmenopausal atrophic vaginitis: Secondary | ICD-10-CM | POA: Diagnosis not present

## 2018-08-31 DIAGNOSIS — Z01419 Encounter for gynecological examination (general) (routine) without abnormal findings: Secondary | ICD-10-CM | POA: Diagnosis not present

## 2018-08-31 NOTE — Progress Notes (Signed)
    Debbie Decker [birth year] [id number]        58 y.o.  [id number] for annual gynecologic exam.  Doing well without gynecologic complaints  Past medical history,surgical history, problem list, medications, allergies, family history and social history were all reviewed and documented as reviewed in the EPIC chart.  ROS:  Performed with pertinent positives and negatives included in the history, assessment and plan.   Additional significant findings : None   Exam: Kennon Portela assistant Vitals:   08/31/18 1510  BP: 110/70  Weight: 107 lb (48.5 kg)  Height: 5' (1.524 m)   Body mass index is 20.9 kg/m.  General appearance:  Normal affect, orientation and appearance. Skin: Grossly normal HEENT: Without gross lesions.  No cervical or supraclavicular adenopathy. Thyroid normal.  Lungs:  Clear without wheezing, rales or rhonchi Cardiac: RR, without RMG Abdominal:  Soft, nontender, without masses, guarding, rebound, organomegaly or hernia Breasts:  Examined lying and sitting without masses, retractions, discharge or axillary adenopathy. Pelvic:  Ext, BUS, Vagina: Normal with atrophic changes  Cervix: Normal with atrophic changes  Uterus: Anteverted, normal size, shape and contour, midline and mobile nontender   Adnexa: Without masses or tenderness    Anus and perineum: Normal excepting old external hemorrhoids  Rectovaginal: Normal sphincter tone without palpated masses or tenderness.    Assessment/Plan:  58 y.o. [id number] female for annual gynecologic exam.   1. Postmenopausal.  No significant menopausal symptoms or any vaginal bleeding. 2. Mammography scheduled next week.  Breast exam normal today. 3. Pap smear/HPV 2019.  No Pap smear done today.  No history of abnormal Pap smears.  Plan repeat Pap smear/HPV at 5-year interval per current screening guidelines. 4. DEXA never.  Recommend baseline DEXA now at age 53 and she agrees to schedule. 5. Colonoscopy 2012 with reported repeat  interval 10 years. 6. Health maintenance.  No routine lab work done as patient reports is done elsewhere.  Follow-up 1 year, sooner as needed.   Dara Lords MD, 3:31 PM 08/31/2018

## 2018-08-31 NOTE — Patient Instructions (Signed)
Followup for bone density as scheduled. 

## 2018-09-08 ENCOUNTER — Encounter: Payer: Self-pay | Admitting: Gynecology

## 2018-09-08 DIAGNOSIS — Z1231 Encounter for screening mammogram for malignant neoplasm of breast: Secondary | ICD-10-CM | POA: Diagnosis not present

## 2018-09-20 ENCOUNTER — Other Ambulatory Visit: Payer: Self-pay

## 2018-09-21 ENCOUNTER — Ambulatory Visit (INDEPENDENT_AMBULATORY_CARE_PROVIDER_SITE_OTHER): Payer: BC Managed Care – PPO

## 2018-09-21 ENCOUNTER — Other Ambulatory Visit: Payer: Self-pay | Admitting: Gynecology

## 2018-09-21 DIAGNOSIS — M8589 Other specified disorders of bone density and structure, multiple sites: Secondary | ICD-10-CM | POA: Diagnosis not present

## 2018-09-21 DIAGNOSIS — Z13820 Encounter for screening for osteoporosis: Secondary | ICD-10-CM

## 2018-09-21 DIAGNOSIS — Z01419 Encounter for gynecological examination (general) (routine) without abnormal findings: Secondary | ICD-10-CM

## 2018-09-21 DIAGNOSIS — Z780 Asymptomatic menopausal state: Secondary | ICD-10-CM | POA: Diagnosis not present

## 2018-09-30 ENCOUNTER — Encounter: Payer: Self-pay | Admitting: Gynecology

## 2018-12-28 ENCOUNTER — Encounter: Payer: Self-pay | Admitting: Gynecology

## 2019-02-08 DIAGNOSIS — Z0000 Encounter for general adult medical examination without abnormal findings: Secondary | ICD-10-CM | POA: Diagnosis not present

## 2019-02-11 DIAGNOSIS — E7800 Pure hypercholesterolemia, unspecified: Secondary | ICD-10-CM | POA: Diagnosis not present

## 2019-10-11 ENCOUNTER — Encounter: Payer: Self-pay | Admitting: Obstetrics and Gynecology

## 2019-12-04 IMAGING — CT CT MAXILLOFACIAL W/O CM
1 series · 10 of 30 positions shown, 13 images · non-contrast
Comparison: None.

CLINICAL DATA: Acute recurrent maxillary sinusitis, muffled ears.

EXAM:
CT MAXILLOFACIAL WITHOUT CONTRAST
TECHNIQUE: Multidetector CT images of the paranasal sinuses were obtained using
the standard protocol without intravenous contrast.

[Series 8: sag soft · sagittal · 0.23mm/px · 10 of 86 slices shown, 13 images]
[im 6/86  brain]
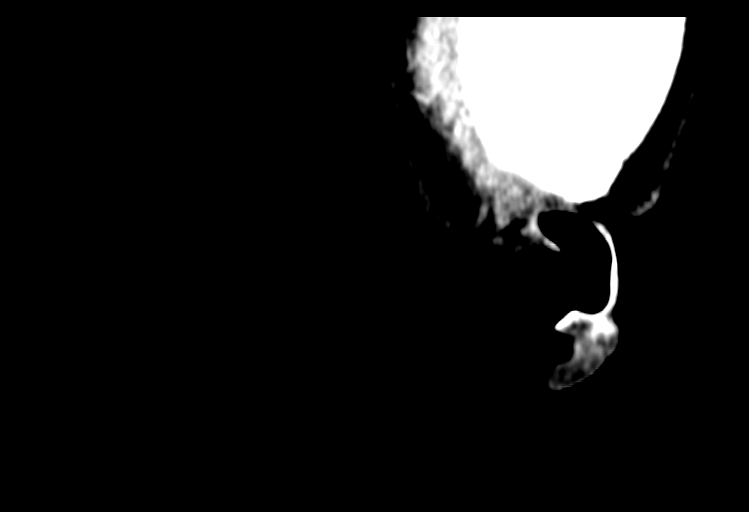
[im 6/86  bone]
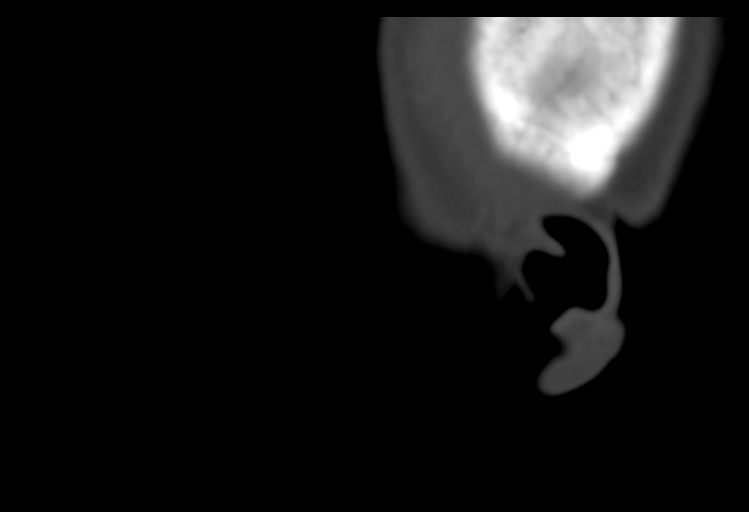
[im 15/86  bone]
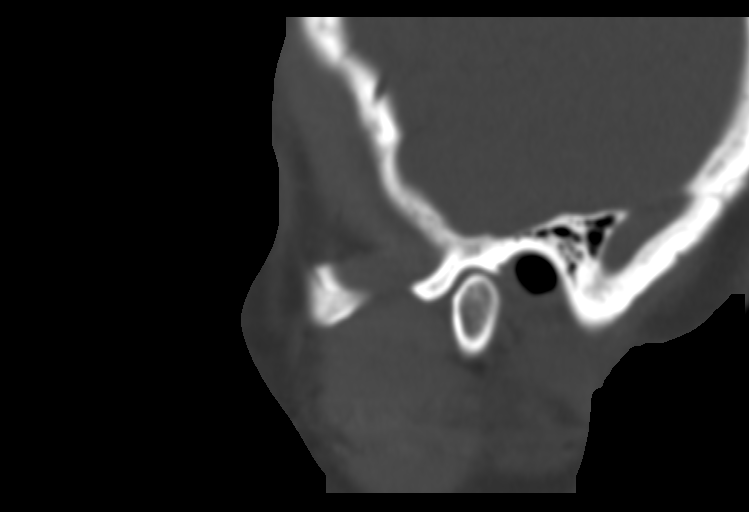
[im 24/86  bone]
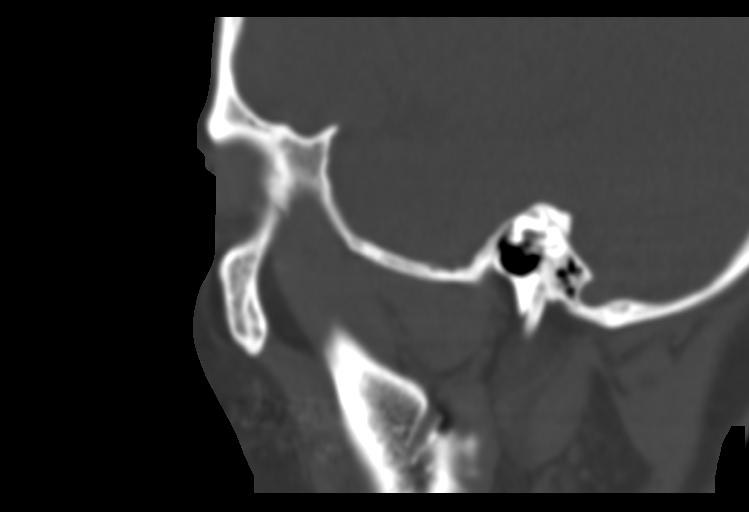
[im 30/86  bone]
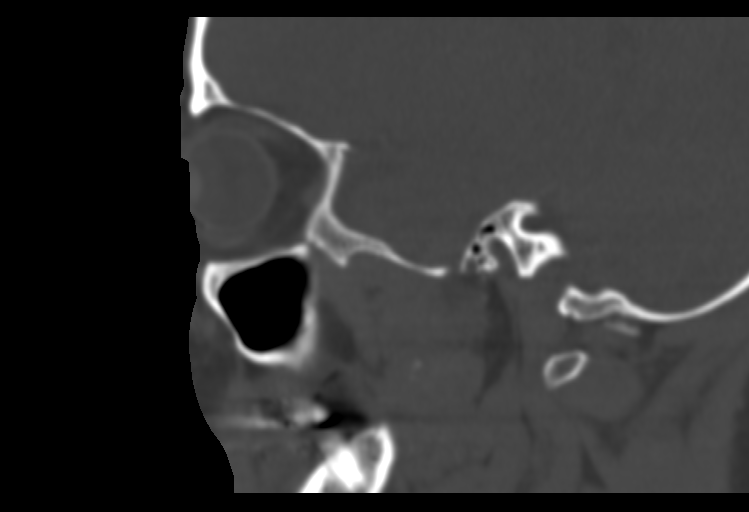
[im 39/86  brain]
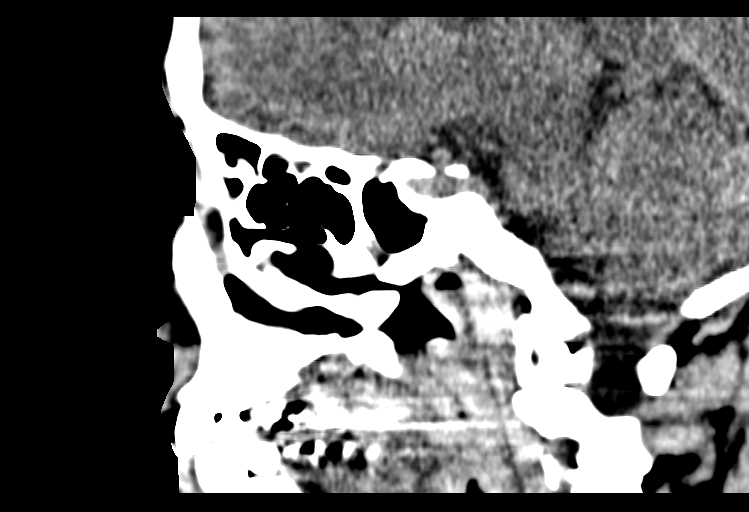
[im 39/86  bone]
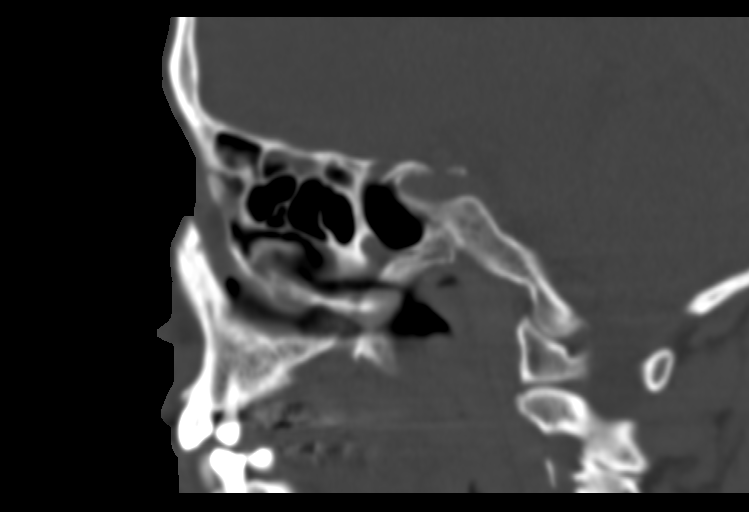
[im 47/86  bone]
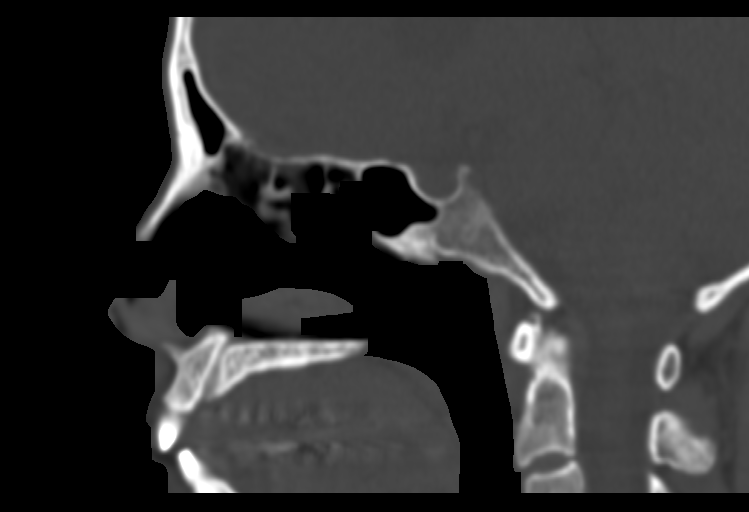
[im 56/86  bone]
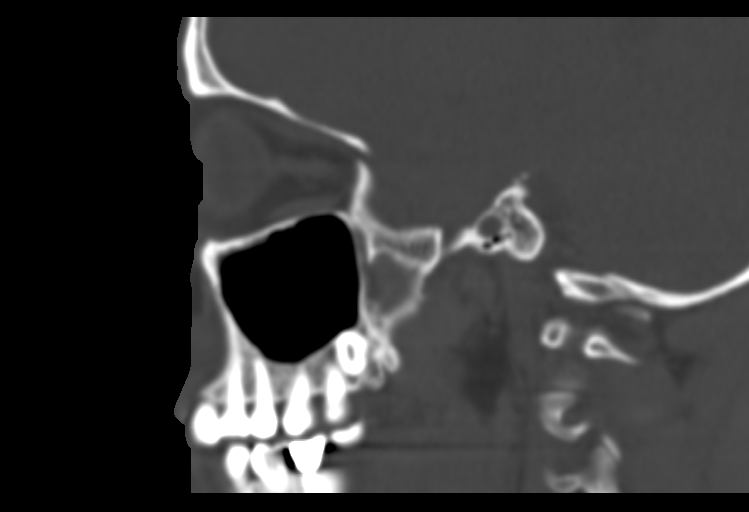
[im 65/86  bone]
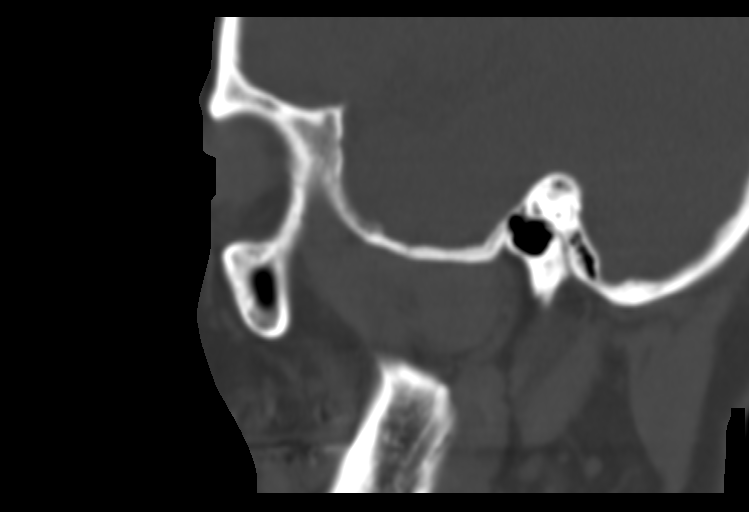
[im 71/86  brain]
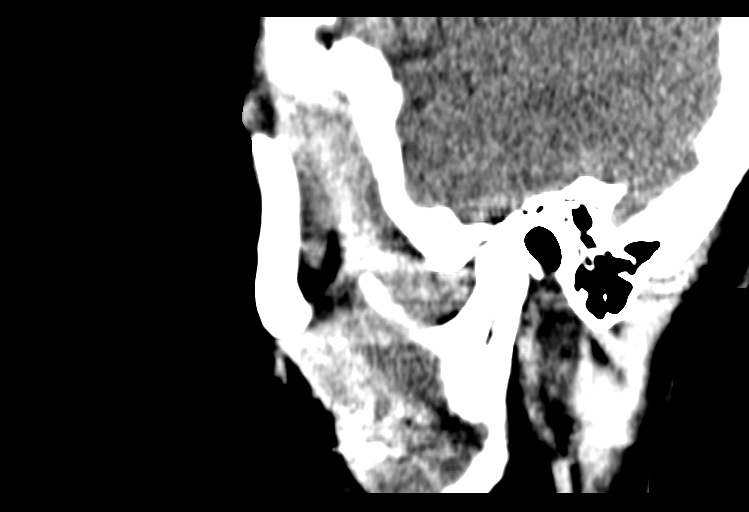
[im 71/86  bone]
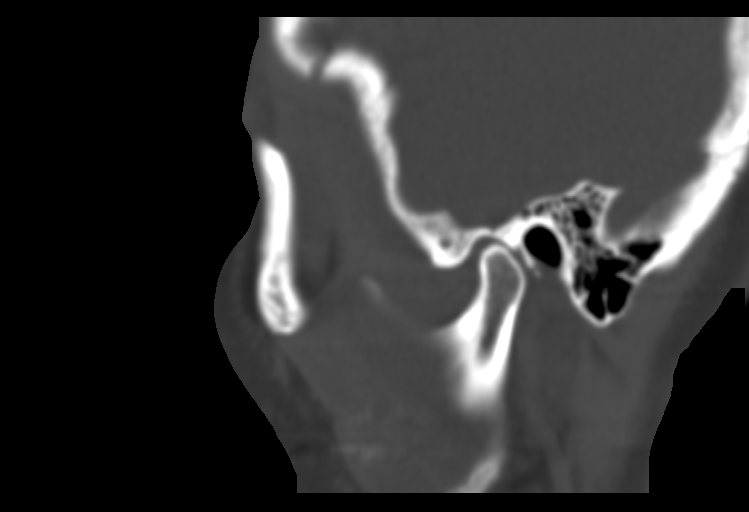
[im 80/86  bone]
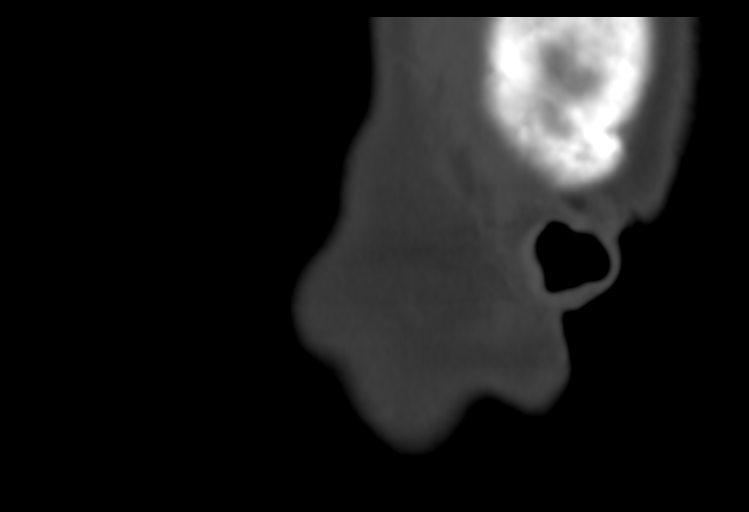

[10 of 30 positions shown; findings below may reference images not displayed]

FINDINGS: Paranasal sinuses:

Frontal: Normally aerated. Patent frontal sinus drainage pathways.

Ethmoid: Essentially clear, with fluid accumulation in a single LEFT
anterior ethmoid air cell.

Maxillary: LEFT maxillary sinus is clear. Mild mucosal thickening in
the dependent portion of the RIGHT maxillary sinus, immediately
adjacent to a large dental implant, related to replacement of a
RIGHT maxillary molar. The tip of the implant breaches the floor of
the RIGHT maxillary sinus; see series 5, image 25, and series 3,
image 17. There is lucency surrounding the implant, suggesting
infection. It is possible, based on the location of the tip of the
implant, that recurrent RIGHT maxillary sinusitis is dentigerous in
nature.

Sphenoid: Normally aerated. Patent sphenoethmoidal recesses.

Right ostiomeatal unit: Patent.

Left ostiomeatal unit: Narrowed, due to prominent ethmoidal bullae.

Nasal passages: Patent. Intact nasal septum bows LEFT-to-RIGHT 2 mm.

Anatomy: No pneumatization superior to anterior ethmoid notches.
Symmetric and intact olfactory grooves and fovea ethmoidalis, Keros
I (1-3mm). Sellar sphenoid pneumatization pattern. No dehiscence of
carotid or optic canals. No onodi cell.

Other: Orbits and intracranial compartment are unremarkable. Visible
mastoid air cells are normally aerated.
IMPRESSION: Minor LEFT ethmoid sinus disease. Narrowed LEFT ostiomeatal unit due
to prominent ethmoidal bullae.

Mild dependent RIGHT maxillary sinus mucosal thickening, with a
large dental implant in the RIGHT maxilla, the tip of which breaches
the floor of that sinus. There is lucency surrounding the implant,
suggesting chronic infection. Correlate clinically for dentigerous
RIGHT maxillary sinusitis.

## 2020-02-08 ENCOUNTER — Encounter: Payer: BC Managed Care – PPO | Admitting: Obstetrics and Gynecology

## 2020-03-26 ENCOUNTER — Other Ambulatory Visit: Payer: Self-pay

## 2020-03-26 ENCOUNTER — Ambulatory Visit (INDEPENDENT_AMBULATORY_CARE_PROVIDER_SITE_OTHER): Payer: 59 | Admitting: Obstetrics and Gynecology

## 2020-03-26 ENCOUNTER — Encounter: Payer: Self-pay | Admitting: Obstetrics and Gynecology

## 2020-03-26 VITALS — BP 130/70 | Ht 60.0 in | Wt 107.0 lb

## 2020-03-26 DIAGNOSIS — Z01419 Encounter for gynecological examination (general) (routine) without abnormal findings: Secondary | ICD-10-CM | POA: Diagnosis not present

## 2020-03-26 NOTE — Progress Notes (Signed)
   Debbie Decker [birth year] [id number]  SUBJECTIVE:  59 y.o. [id number] female for annual routine gynecologic exam. She has no gynecologic concerns.  No current outpatient medications on file.   No current facility-administered medications for this visit.   Allergies: Penicillins  No LMP recorded. Patient is postmenopausal.  Past medical history,surgical history, problem list, medications, allergies, family history and social history were all reviewed and documented as reviewed in the EPIC chart.  ROS: Pertinent positives and negatives as reviewed in HPI   OBJECTIVE:  BP 130/70   Ht 5' (1.524 m)   Wt 107 lb (48.5 kg)   BMI 20.90 kg/m  The patient appears well, alert, oriented, in no distress. ENT normal.  Neck supple. No cervical or supraclavicular adenopathy or thyromegaly.  Lungs are clear, good air entry, no wheezes, rhonchi or rales. S1 and S2 normal, no murmurs, regular rate and rhythm.  Abdomen soft without tenderness, guarding, mass or organomegaly.  Neurological is normal, no focal findings.  BREAST EXAM: breasts appear normal, no suspicious masses, no skin or nipple changes or axillary nodes  PELVIC EXAM: VULVA: normal appearing vulva with atrophic change, no masses, tenderness or lesions, VAGINA: normal appearing vagina with trophic change, normal color and discharge, no lesions, CERVIX: normal appearing cervix without discharge or lesions, UTERUS: uterus is normal size, shape, consistency and nontender, ADNEXA: normal adnexa in size, nontender and no masses  Chaperone: Berna Spare present during the examination  ASSESSMENT:  59 y.o. [id number] here for annual gynecologic exam  PLAN:   1. Postmenopausal.  No significant hot flashes or night sweats.  No vaginal bleeding. 2. Pap smear/HPV 07/2017.  No significant history of abnormal Pap smears.  Next Pap smear due 2024 following the current guidelines recommending the 5 year interval. 3. Mammogram 09/2019.  Normal breast  exam today.  She is reminded to schedule an annual mammogram this year when due. 4. Colonoscopy 2012.  Recommended that she follow up at the recommended interval. 5. Osteopenia DEXA 08/2018.  T score -1.9, FRAX 4.6% / 0.6%.  Next DEXA recommended 2022 at the 2-year interval, encouraged vitamin D and calcium with weightbearing exercise. 6. Health maintenance.  No labs today as she normally has these completed elsewhere.  Return annually or sooner, prn.  Theresia Majors MD 03/26/20

## 2020-12-17 DIAGNOSIS — K620 Anal polyp: Secondary | ICD-10-CM

## 2020-12-17 DIAGNOSIS — K648 Other hemorrhoids: Secondary | ICD-10-CM

## 2020-12-17 HISTORY — DX: Other hemorrhoids: K64.8

## 2020-12-17 HISTORY — DX: Anal polyp: K62.0

## 2021-02-06 HISTORY — PX: COLONOSCOPY: SHX174

## 2021-02-20 ENCOUNTER — Other Ambulatory Visit: Payer: Self-pay

## 2021-02-20 ENCOUNTER — Encounter (HOSPITAL_BASED_OUTPATIENT_CLINIC_OR_DEPARTMENT_OTHER): Payer: Self-pay | Admitting: Surgery

## 2021-02-20 NOTE — Progress Notes (Addendum)
Spoke w/ via phone for pre-op interview---pt Lab needs dos---- none              Lab results------none per anesthesia, surgeon's orders pending, orders requested via Epic IB on 02/20/21 COVID test -----patient states asymptomatic no test needed Arrive at -------0530 on 02/28/21 NPO after MN NO Solid Food.  Clear liquids from MN until---0430 Med rec completed Medications to take morning of surgery -----none Diabetic medication -----n/a Patient instructed no nail polish to be worn day of surgery Patient instructed to bring photo id and insurance card day of surgery Patient aware to have Driver (ride ) / caregiver    for 24 hours after surgery - husband Alinda Money Patient Special Instructions -----none Pre-Op special Istructions -----none Patient verbalized understanding of instructions that were given at this phone interview. Patient denies shortness of breath, chest pain, fever, cough at this phone interview.

## 2021-02-25 ENCOUNTER — Ambulatory Visit: Payer: Self-pay | Admitting: Surgery

## 2021-02-25 DIAGNOSIS — Z01818 Encounter for other preprocedural examination: Secondary | ICD-10-CM

## 2021-02-27 NOTE — Anesthesia Preprocedure Evaluation (Addendum)
Anesthesia Evaluation  Patient identified by MRN, date of birth, ID band Patient awake    Reviewed: Allergy & Precautions, NPO status , Patient's Chart, lab work & pertinent test results  Airway Mallampati: I  TM Distance: >3 FB Neck ROM: Full    Dental no notable dental hx. (+) Teeth Intact, Dental Advisory Given   Pulmonary neg pulmonary ROS,    Pulmonary exam normal breath sounds clear to auscultation       Cardiovascular negative cardio ROS Normal cardiovascular exam Rhythm:Regular Rate:Normal     Neuro/Psych negative neurological ROS  negative psych ROS   GI/Hepatic negative GI ROS, Neg liver ROS,   Endo/Other  negative endocrine ROS  Renal/GU negative Renal ROS  negative genitourinary   Musculoskeletal negative musculoskeletal ROS (+)   Abdominal   Peds  Hematology negative hematology ROS (+)   Anesthesia Other Findings   Reproductive/Obstetrics negative OB ROS                            Anesthesia Physical Anesthesia Plan  ASA: 1  Anesthesia Plan: General   Post-op Pain Management: Tylenol PO (pre-op)   Induction: Intravenous  PONV Risk Score and Plan: 3 and Ondansetron, Dexamethasone, Midazolam and Treatment may vary due to age or medical condition  Airway Management Planned: Oral ETT  Additional Equipment: None  Intra-op Plan:   Post-operative Plan: Extubation in OR  Informed Consent: I have reviewed the patients History and Physical, chart, labs and discussed the procedure including the risks, benefits and alternatives for the proposed anesthesia with the patient or authorized representative who has indicated his/her understanding and acceptance.     Dental advisory given  Plan Discussed with: CRNA  Anesthesia Plan Comments:        Anesthesia Quick Evaluation

## 2021-02-28 ENCOUNTER — Ambulatory Visit (HOSPITAL_BASED_OUTPATIENT_CLINIC_OR_DEPARTMENT_OTHER): Payer: 59 | Admitting: Anesthesiology

## 2021-02-28 ENCOUNTER — Ambulatory Visit (HOSPITAL_BASED_OUTPATIENT_CLINIC_OR_DEPARTMENT_OTHER)
Admission: RE | Admit: 2021-02-28 | Discharge: 2021-02-28 | Disposition: A | Discharge status: Home / Self Care | Payer: 59 | Attending: Surgery | Admitting: Surgery

## 2021-02-28 ENCOUNTER — Encounter (HOSPITAL_BASED_OUTPATIENT_CLINIC_OR_DEPARTMENT_OTHER): Payer: Self-pay | Admitting: Surgery

## 2021-02-28 ENCOUNTER — Encounter (HOSPITAL_BASED_OUTPATIENT_CLINIC_OR_DEPARTMENT_OTHER)
Admission: RE | Disposition: A | Discharge status: Home / Self Care | Payer: Self-pay | Source: Home / Self Care | Attending: Surgery

## 2021-02-28 ENCOUNTER — Other Ambulatory Visit: Payer: Self-pay

## 2021-02-28 DIAGNOSIS — K648 Other hemorrhoids: Secondary | ICD-10-CM | POA: Diagnosis not present

## 2021-02-28 DIAGNOSIS — K644 Residual hemorrhoidal skin tags: Secondary | ICD-10-CM | POA: Insufficient documentation

## 2021-02-28 DIAGNOSIS — Z01818 Encounter for other preprocedural examination: Secondary | ICD-10-CM

## 2021-02-28 DIAGNOSIS — K6289 Other specified diseases of anus and rectum: Secondary | ICD-10-CM | POA: Diagnosis present

## 2021-02-28 HISTORY — PX: TUMOR EXCISION: SHX421

## 2021-02-28 HISTORY — DX: Hyperlipidemia, unspecified: E78.5

## 2021-02-28 HISTORY — PX: RECTAL EXAM UNDER ANESTHESIA: SHX6399

## 2021-02-28 LAB — CBC WITH DIFFERENTIAL/PLATELET
Abs Immature Granulocytes: 0.02 10*3/uL (ref 0.00–0.07)
Basophils Absolute: 0 10*3/uL (ref 0.0–0.1)
Basophils Relative: 0 %
Eosinophils Absolute: 0 10*3/uL (ref 0.0–0.5)
Eosinophils Relative: 1 %
HCT: 43 % (ref 36.0–46.0)
Hemoglobin: 14.6 g/dL (ref 12.0–15.0)
Immature Granulocytes: 0 %
Lymphocytes Relative: 20 %
Lymphs Abs: 1.1 10*3/uL (ref 0.7–4.0)
MCH: 31.7 pg (ref 26.0–34.0)
MCHC: 34 g/dL (ref 30.0–36.0)
MCV: 93.5 fL (ref 80.0–100.0)
Monocytes Absolute: 0.4 10*3/uL (ref 0.1–1.0)
Monocytes Relative: 7 %
Neutro Abs: 3.8 10*3/uL (ref 1.7–7.7)
Neutrophils Relative %: 72 %
Platelets: 219 10*3/uL (ref 150–400)
RBC: 4.6 MIL/uL (ref 3.87–5.11)
RDW: 12.9 % (ref 11.5–15.5)
WBC: 5.4 10*3/uL (ref 4.0–10.5)
nRBC: 0 % (ref 0.0–0.2)

## 2021-02-28 SURGERY — EXCISION, NEOPLASM, RECTUM
Anesthesia: General | Site: Rectum

## 2021-02-28 MED ORDER — HYDROMORPHONE HCL 1 MG/ML IJ SOLN
0.2500 mg | INTRAMUSCULAR | Status: DC | PRN
Start: 2021-02-28 — End: 2021-02-28

## 2021-02-28 MED ORDER — OXYCODONE HCL 5 MG PO TABS: 5.0000 mg | ORAL_TABLET | Freq: Once | ORAL | Status: DC | PRN

## 2021-02-28 MED ORDER — ACETAMINOPHEN 500 MG PO TABS
1000.0000 mg | ORAL_TABLET | ORAL | Status: AC
  Administered 2021-02-28: 1000 mg via ORAL

## 2021-02-28 MED ORDER — SUGAMMADEX SODIUM 200 MG/2ML IV SOLN: INTRAVENOUS | Status: DC | PRN

## 2021-02-28 MED ORDER — DEXAMETHASONE SODIUM PHOSPHATE 4 MG/ML IJ SOLN
INTRAMUSCULAR | Status: DC | PRN
  Administered 2021-02-28: 5 mg via INTRAVENOUS

## 2021-02-28 MED ORDER — OXYCODONE HCL 5 MG/5ML PO SOLN: 5.0000 mg | Freq: Once | ORAL | Status: DC | PRN

## 2021-02-28 MED ORDER — 0.9 % SODIUM CHLORIDE (POUR BTL) OPTIME
TOPICAL | Status: DC | PRN
  Administered 2021-02-28: 500 mL

## 2021-02-28 MED ORDER — ACETAMINOPHEN 500 MG PO TABS
ORAL_TABLET | ORAL | Status: AC
  Filled 2021-02-28: qty 2

## 2021-02-28 MED ORDER — MIDAZOLAM HCL 2 MG/2ML IJ SOLN
INTRAMUSCULAR | Status: AC
  Filled 2021-02-28: qty 2

## 2021-02-28 MED ORDER — PROPOFOL 10 MG/ML IV BOLUS
INTRAVENOUS | Status: AC
  Filled 2021-02-28: qty 20

## 2021-02-28 MED ORDER — LIDOCAINE HCL (CARDIAC) PF 100 MG/5ML IV SOSY
PREFILLED_SYRINGE | INTRAVENOUS | Status: DC | PRN
  Administered 2021-02-28: 50 mg via INTRAVENOUS

## 2021-02-28 MED ORDER — BUPIVACAINE LIPOSOME 1.3 % IJ SUSP
INTRAMUSCULAR | Status: DC | PRN
  Administered 2021-02-28: 20 mL

## 2021-02-28 MED ORDER — BUPIVACAINE LIPOSOME 1.3 % IJ SUSP: 20.0000 mL | Freq: Once | INTRAMUSCULAR | Status: DC

## 2021-02-28 MED ORDER — ONDANSETRON HCL 4 MG/2ML IJ SOLN
INTRAMUSCULAR | Status: DC | PRN
  Administered 2021-02-28: 4 mg via INTRAVENOUS

## 2021-02-28 MED ORDER — ROCURONIUM BROMIDE 100 MG/10ML IV SOLN
INTRAVENOUS | Status: DC | PRN
  Administered 2021-02-28: 50 mg via INTRAVENOUS

## 2021-02-28 MED ORDER — KETOROLAC TROMETHAMINE 30 MG/ML IJ SOLN: 30.0000 mg | Freq: Once | INTRAMUSCULAR | Status: DC | PRN

## 2021-02-28 MED ORDER — FENTANYL CITRATE (PF) 100 MCG/2ML IJ SOLN
INTRAMUSCULAR | Status: AC
  Filled 2021-02-28: qty 2

## 2021-02-28 MED ORDER — SUGAMMADEX SODIUM 200 MG/2ML IV SOLN
INTRAVENOUS | Status: DC | PRN
  Administered 2021-02-28: 200 mg via INTRAVENOUS

## 2021-02-28 MED ORDER — TRAMADOL HCL 50 MG PO TABS: 50.0000 mg | ORAL_TABLET | Freq: Four times a day (QID) | ORAL | 0 refills | Status: AC | PRN

## 2021-02-28 MED ORDER — BUPIVACAINE-EPINEPHRINE 0.25% -1:200000 IJ SOLN
INTRAMUSCULAR | Status: DC | PRN
  Administered 2021-02-28: 20 mL

## 2021-02-28 MED ORDER — MIDAZOLAM HCL 2 MG/2ML IJ SOLN
INTRAMUSCULAR | Status: DC | PRN
  Administered 2021-02-28: 1 mg via INTRAVENOUS

## 2021-02-28 MED ORDER — MEPERIDINE HCL 25 MG/ML IJ SOLN: 6.2500 mg | INTRAMUSCULAR | Status: DC | PRN

## 2021-02-28 MED ORDER — PROPOFOL 10 MG/ML IV BOLUS
INTRAVENOUS | Status: DC | PRN
  Administered 2021-02-28: 80 mg via INTRAVENOUS

## 2021-02-28 MED ORDER — LACTATED RINGERS IV SOLN: INTRAVENOUS | Status: DC

## 2021-02-28 MED ORDER — PROMETHAZINE HCL 25 MG/ML IJ SOLN: 6.2500 mg | INTRAMUSCULAR | Status: DC | PRN

## 2021-02-28 MED ORDER — FENTANYL CITRATE (PF) 100 MCG/2ML IJ SOLN
INTRAMUSCULAR | Status: DC | PRN
  Administered 2021-02-28: 50 ug via INTRAVENOUS

## 2021-02-28 SURGICAL SUPPLY — 64 items
APL SKNCLS STERI-STRIP NONHPOA (GAUZE/BANDAGES/DRESSINGS) ×1
BENZOIN TINCTURE PRP APPL 2/3 (GAUZE/BANDAGES/DRESSINGS) ×2 IMPLANT
BLADE EXTENDED COATED 6.5IN (ELECTRODE) IMPLANT
BLADE SURG 10 STRL SS (BLADE) IMPLANT
BLADE SURG 15 STRL LF DISP TIS (BLADE) ×1 IMPLANT
BLADE SURG 15 STRL SS (BLADE) ×2
COVER BACK TABLE 60X90IN (DRAPES) ×2 IMPLANT
COVER MAYO STAND STRL (DRAPES) ×2 IMPLANT
DECANTER SPIKE VIAL GLASS SM (MISCELLANEOUS) IMPLANT
DRAPE HYSTEROSCOPY (MISCELLANEOUS) IMPLANT
DRAPE LAPAROTOMY 100X72 PEDS (DRAPES) ×2 IMPLANT
DRAPE SHEET LG 3/4 BI-LAMINATE (DRAPES) IMPLANT
DRAPE UTILITY XL STRL (DRAPES) ×2 IMPLANT
DRSG PAD ABDOMINAL 8X10 ST (GAUZE/BANDAGES/DRESSINGS) ×2 IMPLANT
GAUZE 4X4 16PLY ~~LOC~~+RFID DBL (SPONGE) ×2 IMPLANT
GAUZE SPONGE 4X4 12PLY STRL (GAUZE/BANDAGES/DRESSINGS) ×2 IMPLANT
GLOVE SRG 8 PF TXTR STRL LF DI (GLOVE) ×1 IMPLANT
GLOVE SURG ENC MOIS LTX SZ7.5 (GLOVE) ×2 IMPLANT
GLOVE SURG UNDER POLY LF SZ8 (GLOVE) ×2
GOWN STRL REUS W/TWL LRG LVL3 (GOWN DISPOSABLE) ×2 IMPLANT
HYDROGEN PEROXIDE 16OZ (MISCELLANEOUS) IMPLANT
IV CATH 14GX2 1/4 (CATHETERS) IMPLANT
IV CATH 18G SAFETY (IV SOLUTION) IMPLANT
KIT SIGMOIDOSCOPE (SET/KITS/TRAYS/PACK) IMPLANT
KIT TURNOVER CYSTO (KITS) ×2 IMPLANT
LEGGING LITHOTOMY PAIR STRL (DRAPES) IMPLANT
LOOP VESSEL MAXI BLUE (MISCELLANEOUS) IMPLANT
NDL SAFETY ECLIPSE 18X1.5 (NEEDLE) IMPLANT
NEEDLE HYPO 18GX1.5 SHARP (NEEDLE)
NEEDLE HYPO 22GX1.5 SAFETY (NEEDLE) ×2 IMPLANT
NEEDLE HYPO 25X1 1.5 SAFETY (NEEDLE) IMPLANT
NS IRRIG 500ML POUR BTL (IV SOLUTION) ×2 IMPLANT
PACK BASIN DAY SURGERY FS (CUSTOM PROCEDURE TRAY) ×2 IMPLANT
PANTS MESH DISP LRG (UNDERPADS AND DIAPERS) ×1 IMPLANT
PANTS MESH DISPOSABLE L (UNDERPADS AND DIAPERS) ×1
PENCIL SMOKE EVACUATOR (MISCELLANEOUS) ×2 IMPLANT
SOL PREP POV-IOD 4OZ 10% (MISCELLANEOUS) ×2 IMPLANT
SPONGE HEMORRHOID 8X3CM (HEMOSTASIS) IMPLANT
SPONGE SURGIFOAM ABS GEL 12-7 (HEMOSTASIS) IMPLANT
SUCTION FRAZIER HANDLE 10FR (MISCELLANEOUS)
SUCTION TUBE FRAZIER 10FR DISP (MISCELLANEOUS) IMPLANT
SUT CHROMIC 2 0 SH (SUTURE) IMPLANT
SUT CHROMIC 3 0 SH 27 (SUTURE) IMPLANT
SUT MNCRL AB 4-0 PS2 18 (SUTURE) IMPLANT
SUT SILK 0 PSL NDL (SUTURE) IMPLANT
SUT SILK 0 TIES 10X30 (SUTURE) IMPLANT
SUT SILK 2 0 (SUTURE)
SUT SILK 2-0 18XBRD TIE 12 (SUTURE) IMPLANT
SUT VIC AB 2-0 SH 27 (SUTURE) ×2
SUT VIC AB 2-0 SH 27XBRD (SUTURE) ×1 IMPLANT
SUT VIC AB 3-0 SH 18 (SUTURE) IMPLANT
SUT VIC AB 3-0 SH 27 (SUTURE) ×2
SUT VIC AB 3-0 SH 27X BRD (SUTURE) ×1 IMPLANT
SUT VIC AB 3-0 SH 27XBRD (SUTURE) IMPLANT
SUT VIC AB 4-0 P-3 18XBRD (SUTURE) IMPLANT
SUT VIC AB 4-0 P3 18 (SUTURE)
SYR 20ML LL LF (SYRINGE) IMPLANT
SYR BULB IRRIG 60ML STRL (SYRINGE) ×2 IMPLANT
SYR CONTROL 10ML LL (SYRINGE) ×2 IMPLANT
SYR TB 1ML LL NO SAFETY (SYRINGE) IMPLANT
TOWEL OR 17X26 10 PK STRL BLUE (TOWEL DISPOSABLE) ×2 IMPLANT
TRAY DSU PREP LF (CUSTOM PROCEDURE TRAY) ×2 IMPLANT
TUBE CONNECTING 12X1/4 (SUCTIONS) ×2 IMPLANT
YANKAUER SUCT BULB TIP NO VENT (SUCTIONS) ×2 IMPLANT

## 2021-02-28 NOTE — Transfer of Care (Signed)
Immediate Anesthesia Transfer of Care Note  Patient: Debbie Decker  Procedure(s) Performed: EXCISION OF ANAL CANAL POLYPOID TISSUE (TRANSANAL) (Rectum) ANORECTAL EXAM UNDER ANESTHESIA (Rectum)  Patient Location: PACU  Anesthesia Type:General  Level of Consciousness: awake, alert  and patient cooperative  Airway & Oxygen Therapy: Patient Spontanous Breathing  Post-op Assessment: Report given to RN and Post -op Vital signs reviewed and stable  Post vital signs: Reviewed and stable  Last Vitals:  Vitals Value Taken Time  BP    Temp    Pulse    Resp    SpO2      Last Pain:  Vitals:   02/28/21 0611  TempSrc: Oral  PainSc: 0-No pain      Patients Stated Pain Goal: 6 (02/28/21 2575)  Complications: No notable events documented.

## 2021-02-28 NOTE — Discharge Instructions (Addendum)
ANORECTAL SURGERY: POST OP INSTRUCTIONS  DIET: Follow a light bland diet the first 24 hours after arrival home, such as soup, liquids, crackers, etc.  Be sure to include lots of fluids daily.  Avoid fast food or heavy meals as your are more likely to get nauseated.  Eat a low fat diet the next few days after surgery.   Some bleeding with bowel movements is expected for the first couple of days but this should stop in between bowel movements  Take your usually prescribed home medications unless otherwise directed.  PAIN CONTROL: It is helpful to take an over-the-counter pain medication regularly for the first few days/weeks.  Choose from the following that works best for you: Ibuprofen (Advil, etc) Three 200mg  tabs every 6 hours as needed. Acetaminophen (Tylenol, etc) 500-650mg  every 6 hours as needed (May take again at 12:15pm) NOTE: You may take both of these medications together - most patients find it most helpful when alternating between the two (i.e. Ibuprofen at 6am, tylenol at 9am, ibuprofen at 12pm .. ) A  prescription for pain medication may have been prescribed for you at discharge.  Take your pain medication as prescribed.  If you are having problems/concerns with the prescription medicine, please call [facility] for further advice.  Avoid getting constipated.  Between the surgery and the pain medications, it is common to experience some constipation.  Increasing fluid intake (64oz of water per day) and taking a fiber supplement (such as Metamucil, Citrucel, FiberCon) 1-2 times a day regularly will usually help prevent this problem from occurring.  Take Miralax (over the counter) 1-2x/day while taking a narcotic pain medication. If no bowel movement after 48hours, you may additionally take a laxative like a bottle of Milk of Magnesia which can be purchased over the counter. Avoid enemas if possible as these are often painful.   Watch out for diarrhea.  If you have many loose bowel movements,  simplify your diet to bland foods.  Stop any stool softeners and decrease your fiber supplement. If this worsens or does not improve, please call us.  Wash / shower every day.  If you were discharged with a dressing, you may remove this the day after your surgery. You may shower normally, getting soap/water on your wound, particularly after bowel movements.  Soaking in a warm bath filled a couple inches ("Sitz bath") is a great way to clean the area after a bowel movement and many patients find it is a way to soothe the area.  ACTIVITIES as tolerated:   You may resume regular (light) daily activities beginning the next day--such as daily self-care, walking, climbing stairs--gradually increasing activities as tolerated.  If you can walk 30 minutes without difficulty, it is safe to try more intense activity such as jogging, treadmill, bicycling, low-impact aerobics, etc. Refrain from any heavy lifting or straining for the first 2 weeks after your procedure, particularly if your surgery was for hemorrhoids. Avoid activities that make your pain worse You may drive when you are no longer taking prescription pain medication, you can comfortably wear a seatbelt, and you can safely maneuver your car and apply brakes.  FOLLOW UP in our office Please call [facility] at [phone] to set up an appointment to see your surgeon in the office for a follow-up appointment approximately 2 weeks after your surgery. Make sure that you call for this appointment the day you arrive home to insure a convenient appointment time.  9. If you have disability or family leave forms  that need to be completed, you may have them completed by your primary care physician's office; for return to work instructions, please ask our office staff and they will be happy to assist you in obtaining this documentation   When to call us [phone]: Poor pain control Reactions / problems with new medications (rash/itching, etc)  Fever over  101.5 F (38.5 C) Inability to urinate Nausea/vomiting Worsening swelling or bruising Continued bleeding from incision. Increased pain, redness, or drainage from the incision  The clinic staff is available to answer your questions during regular business hours (8:30am-5pm).  Please don't hesitate to call and ask to speak to one of our nurses for clinical concerns.   A surgeon from [facility] is always on call at the hospitals   If you have a medical emergency, go to the nearest emergency room or call 911.   [facility] [address], [place], [place]  [address] MAIN: [phone] FAX: [phone] [email or web address]    Information for Discharge Teaching: EXPAREL (bupivacaine liposome injectable suspension)   Your surgeon or anesthesiologist gave you EXPAREL(bupivacaine) to help control your pain after surgery.  EXPAREL is a local anesthetic that provides pain relief by numbing the tissue around the surgical site. EXPAREL is designed to release pain medication over time and can control pain for up to 72 hours. Depending on how you respond to EXPAREL, you may require less pain medication during your recovery.  Possible side effects: Temporary loss of sensation or ability to move in the area where bupivacaine was injected. Nausea, vomiting, constipation Rarely, numbness and tingling in your mouth or lips, lightheadedness, or anxiety may occur. Call your doctor right away if you think you may be experiencing any of these sensations, or if you have other questions regarding possible side effects.  Follow all other discharge instructions given to you by your surgeon or nurse. Eat a healthy diet and drink plenty of water or other fluids.  If you return to the hospital for any reason within 96 hours following the administration of EXPAREL, it is important for health care providers to know that you have  received this anesthetic. A teal colored band has been placed on your arm with the date, time and amount of EXPAREL you have received in order to alert and inform your health care providers. Please leave this armband in place for the full 96 hours following administration, and then you may remove the band.   Post Anesthesia Home Care Instructions  Activity: Get plenty of rest for the remainder of the day. A responsible individual must stay with you for 24 hours following the procedure.  For the next 24 hours, DO NOT: -Drive a car -Advertising copywriter -Drink alcoholic beverages -Take any medication unless instructed by your physician -Make any legal decisions or sign important papers.  Meals: Start with liquid foods such as gelatin or soup. Progress to regular foods as tolerated. Avoid greasy, spicy, heavy foods. If nausea and/or vomiting occur, drink only clear liquids until the nausea and/or vomiting subsides. Call your physician if vomiting continues.  Special Instructions/Symptoms: Your throat may feel dry or sore from the anesthesia or the breathing tube placed in your throat during surgery. If this causes discomfort, gargle with warm salt water. The discomfort should disappear within 24 hours.

## 2021-02-28 NOTE — Anesthesia Postprocedure Evaluation (Signed)
Anesthesia Post Note  Patient: [name]  Procedure(s) Performed: EXCISION OF ANAL CANAL POLYPOID TISSUE (TRANSANAL) (Rectum) ANORECTAL EXAM UNDER ANESTHESIA (Rectum)     Patient location during evaluation: PACU Anesthesia Type: General Level of consciousness: awake and alert, oriented and patient cooperative Pain management: pain level controlled Vital Signs Assessment: post-procedure vital signs reviewed and stable Respiratory status: spontaneous breathing, nonlabored ventilation and respiratory function stable Cardiovascular status: blood pressure returned to baseline and stable Postop Assessment: no apparent nausea or vomiting Anesthetic complications: no   No notable events documented.  Last Vitals:  Vitals:   02/28/21 0915 02/28/21 0948  BP: 135/88 (!) 163/69  Pulse: 70 69  Resp: 16 14  Temp: (!) 36.4 C 36.5 C  SpO2: 100% 100%    Last Pain:  Vitals:   02/28/21 0935  TempSrc:   PainSc: 0-No pain                 Lannie Fields

## 2021-02-28 NOTE — H&P (Signed)
CC: Here today for surgery  HPI: Debbie Decker is an 60 y.o. female with history of HLD, referred to see me by my partner Dr. Harlow Asa whom she saw 11/27/20. She was sent by Dr. Kenton Kingfisher for prolapsing internal hemorrhoids. She has had occasional bleeding but no pain. She denies any prior anorectal procedures or surgeries. Last colonoscopy was approximately 10 years ago by report of Dr. Harlow Asa. She occasionally has issues with constipation at which point her prolapsing tissue is more prominent.  She is not currently taking a fiber supplement. She reports she tries to supplement her diet with vegetables. She has 1 bowel movement per day. This is generally formed. She states that she will spend anywhere from 10 to upwards of 20 minutes on the commode depending on how hard her stool is. She drinks 6 to 8 glasses of water per day.  She is following with [facility] and states she is scheduled for a colonoscopy in November, 2022.  She denies any changes in her health or health hx since we met in the office, states she is ready for surgery.  PMH: HLD  PSH: She denies any prior anorectal procedures or surgeries  FHx: Denies any known family history of colorectal, breast, endometrial or ovarian cancer  Social Hx: Denies use of tobacco/EtOH/illicit drug. 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  Past Medical History:  Diagnosis Date   Anal polyp 12/17/2020   Hyperlipidemia    Osteopenia 08/2018   T score -1.9 max 4.6% / 0.6%   Prolapsed internal hemorrhoids 12/17/2020    Past Surgical History:  Procedure Laterality Date   COLONOSCOPY  2012   COLONOSCOPY  02/06/2021   [facility], 1 benign colon polyp    Family History  Problem Relation Age of Onset   Hypertension Mother    Lung cancer Mother    Lung cancer Father     Social:  reports that she has never smoked. She has never used smokeless tobacco. She reports that she does not drink alcohol and does not use  drugs.  Allergies:  Allergies  Allergen Reactions   Penicillins Other (See Comments)    Fainting    Medications: I have reviewed the patient's current medications.  Results for orders placed or performed during the hospital encounter of 02/28/21 (from the past 48 hour(s))  CBC WITH DIFFERENTIAL     Status: None   Collection Time: 02/28/21  6:15 AM  Result Value Ref Range   WBC 5.4 4.0 - 10.5 K/uL   RBC 4.60 3.87 - 5.11 MIL/uL   Hemoglobin 14.6 12.0 - 15.0 g/dL   HCT 43.0 36.0 - 46.0 %   MCV 93.5 80.0 - 100.0 fL   MCH 31.7 26.0 - 34.0 pg   MCHC 34.0 30.0 - 36.0 g/dL   RDW 12.9 11.5 - 15.5 %   Platelets 219 150 - 400 K/uL   nRBC 0.0 0.0 - 0.2 %   Neutrophils Relative % 72 %   Neutro Abs 3.8 1.7 - 7.7 K/uL   Lymphocytes Relative 20 %   Lymphs Abs 1.1 0.7 - 4.0 K/uL   Monocytes Relative 7 %   Monocytes Absolute 0.4 0.1 - 1.0 K/uL   Eosinophils Relative 1 %   Eosinophils Absolute 0.0 0.0 - 0.5 K/uL   Basophils Relative 0 %   Basophils Absolute 0.0 0.0 - 0.1 K/uL   Immature Granulocytes 0 %   Abs Immature Granulocytes 0.02 0.00 - 0.07 K/uL  Comment: Performed at [facility], [place] [address], [place], [place] [address]    No results found.  ROS - all of the below systems have been reviewed with the patient and positives are indicated with bold text General: chills, fever or night sweats Eyes: blurry vision or double vision ENT: epistaxis or sore throat Allergy/Immunology: itchy/watery eyes or nasal congestion Hematologic/Lymphatic: bleeding problems, blood clots or swollen lymph nodes Endocrine: temperature intolerance or unexpected weight changes Breast: new or changing breast lumps or nipple discharge Resp: cough, shortness of breath, or wheezing CV: chest pain or dyspnea on exertion GI: as per HPI GU: dysuria, trouble voiding, or hematuria MSK: joint pain or joint stiffness Neuro: TIA or stroke symptoms Derm: pruritus and skin lesion  changes Psych: anxiety and depression  PE Blood pressure (!) 145/85, pulse 71, temperature 97.8 F (36.6 C), temperature source Oral, resp. rate 15, height 5' (1.524 m), weight 48.7 kg, SpO2 98 %. Constitutional: NAD; conversant Eyes: Moist conjunctiva; no lid lag Lungs: Normal respiratory effort CV: RRR GI: Abd soft MSK: Normal range of motion of extremities Psychiatric: Appropriate affect; alert and oriented x3  Results for orders placed or performed during the hospital encounter of 02/28/21 (from the past 48 hour(s))  CBC WITH DIFFERENTIAL     Status: None   Collection Time: 02/28/21  6:15 AM  Result Value Ref Range   WBC 5.4 4.0 - 10.5 K/uL   RBC 4.60 3.87 - 5.11 MIL/uL   Hemoglobin 14.6 12.0 - 15.0 g/dL   HCT 43.0 36.0 - 46.0 %   MCV 93.5 80.0 - 100.0 fL   MCH 31.7 26.0 - 34.0 pg   MCHC 34.0 30.0 - 36.0 g/dL   RDW 12.9 11.5 - 15.5 %   Platelets 219 150 - 400 K/uL   nRBC 0.0 0.0 - 0.2 %   Neutrophils Relative % 72 %   Neutro Abs 3.8 1.7 - 7.7 K/uL   Lymphocytes Relative 20 %   Lymphs Abs 1.1 0.7 - 4.0 K/uL   Monocytes Relative 7 %   Monocytes Absolute 0.4 0.1 - 1.0 K/uL   Eosinophils Relative 1 %   Eosinophils Absolute 0.0 0.0 - 0.5 K/uL   Basophils Relative 0 %   Basophils Absolute 0.0 0.0 - 0.1 K/uL   Immature Granulocytes 0 %   Abs Immature Granulocytes 0.02 0.00 - 0.07 K/uL    Comment: Performed at [facility], [place] [address] [place],  [address]    No results found.   A/P: Debbie Decker is an 60 y.o. female with hx of HLD here for surgery re: prolapsing internal hemorrhoids and anal canal polypoid tissue  -I have recommended she begin taking a fiber supplement every day such as Metamucil or Benefiber. We discussed the importance of drinking 64 ounces of water per day (noncaffeinated). We also discussed the importance of minimizing time on commode to 2 to 3 minutes. -The anatomy and physiology of the anal canal was discussed with the  patient with associated pictures. The pathophysiology of hemorrhoids and anal canal polyps was discussed at length with associated pictures and illustrations. She has not tried maximal medical therapy for her hemorrhoids. That said, she does have some polypoid-like tissue in the anal canal-appears to be left posterior lateral. We discussed excision of this. We discussed that with fiber, water, and minimizing time on commode to 2 to 3 minutes, much of her hemorrhoidal issues will otherwise likely resolve. -We have reviewed options going forward  including further observation vs surgery - transanal excision of anal canal polypoid tissue; anorectal exam under anesthesia -The planned procedure, material risks (including, but not limited to, pain, bleeding, infection, scarring, need for blood transfusion, damage to anal sphincter, incontinence of gas and/or stool, need for additional procedures, recurrence, pneumonia, heart attack, stroke, death) benefits and alternatives to surgery were discussed at length. I noted a good probability that the procedure would help improve their symptoms. The patient's questions were answered to her satisfaction, she voiced understanding and elected to proceed with surgery. Additionally, we discussed typical postoperative expectations and the recovery process.  Nadeen Landau, MD [facility] Use [email or web address] to contact on call provider

## 2021-02-28 NOTE — Anesthesia Procedure Notes (Signed)
Procedure Name: Intubation Date/Time: 02/28/2021 7:39 AM Performed by: Georgeanne Nim, CRNA Pre-anesthesia Checklist: Patient identified, Emergency Drugs available, Suction available, Patient being monitored and Timeout performed Patient Re-evaluated:Patient Re-evaluated prior to induction Oxygen Delivery Method: Circle system utilized Preoxygenation: Pre-oxygenation with 100% oxygen Induction Type: IV induction Ventilation: Mask ventilation without difficulty Laryngoscope Size: Mac and 3 Grade View: Grade I Tube size: 7.0 mm Number of attempts: 1 Airway Equipment and Method: Stylet Placement Confirmation: ETT inserted through vocal cords under direct vision, positive ETCO2, CO2 detector and breath sounds checked- equal and bilateral Secured at: 20 cm Tube secured with: Tape Dental Injury: Teeth and Oropharynx as per pre-operative assessment

## 2021-02-28 NOTE — Op Note (Signed)
02/28/2021  8:47 AM  PATIENT:  Debbie Decker  60 y.o. female  Patient Care Team: Johny Blamer, MD as PCP - General (Family Medicine)  PRE-OPERATIVE DIAGNOSIS:  Anal canal polypoid tissue  POST-OPERATIVE DIAGNOSIS:  Same  PROCEDURE:   Transanal excision of left posterior anal canal polypoid lesion under anoscopic guidance Anorectal exam under anesthesia  SURGEON:  Surgeon(s): Andria Meuse, MD  ASSISTANT: OR staff   ANESTHESIA:   local and general  SPECIMEN:  Anorectal polypoid lesion - left posterior  DISPOSITION OF SPECIMEN:  PATHOLOGY  COUNTS:  Sponge, needle, and instrument counts were reported correct x2 at conclusion.  EBL: 10 mL  PLAN OF CARE: Discharge to home after PACU  PATIENT DISPOSITION:  PACU - hemodynamically stable.  OR FINDINGS: External tags, small internal hemorrhoids 3 columns. Left posterior internal column with overlying 1 x 1 cm polypoid appearing tissue. This was fully excised.  DESCRIPTION: The patient was identified in the preoperative holding area and taken to the OR. SCDs were applied. She then underwent general endotracheal anesthesia without difficulty. The patient was then rolled onto the OR table in the prone jackknife position. Pressure points were then evaluated and padded. Benzoin was applied to the buttocks and they were gently taped apart.  She was then prepped and draped in usual sterile fashion.  A surgical timeout was performed indicating the correct patient, procedure, and positioning.  A perianal block was then created using a dilute mixture of 0.25% Marcaine with epinephrine and Exparel.  After ascertaining an appropriate level of anesthesia had been achieved, a well lubricated digital rectal exam was performed. This demonstrated no palpable masses but a partially prolapsing polypoid lesion left posterior.  A Hill-Ferguson anoscope was into the anal canal and circumferential inspection demonstrated a ~1 x 1 cm left posterior  polypoid lesion. Small tags externally. Small 3 column internal hemorrhoids as well.    Attention directed at the left posterior polypoid lesion. This was done under anoscopic guidance. The lesion was elevated with a DeBakey forcep and margins of normal tissue marked. This was then excised using cutting current. The underlying sphincter muscle fibers were dissected away during removal and no muscle was divided. This was passed off as specimen. There was additional internal hemorrhoidal tissue at this location that was also excised in a similar manner and included in same container as specimen. Hemostasis was achieved with electrocautery. A 2-0 vicryl figure of eight stitch placed at the apex of hemorrhoidal blood supply. The defect was then closed with a running 2-0 vicryl suture, transitioning to a 3-0 vicryl suture below the dentate line. The anal canal was inspected and irrigated. Hemostasis achieved. No other pathology is identified on circumferential inspection. All counts are reported correct. A dressing consisting of 4x4s, ABD, and mesh underwear is placed. She is transferred to a stretcher, awakened from anesthesia, extubated and transported to PACU in satisfactory condition.  DISPOSITION: PACU in satisfactory condition.

## 2021-03-01 ENCOUNTER — Encounter (HOSPITAL_BASED_OUTPATIENT_CLINIC_OR_DEPARTMENT_OTHER): Payer: Self-pay | Admitting: Surgery

## 2021-03-01 LAB — SURGICAL PATHOLOGY

## 2021-03-03 ENCOUNTER — Other Ambulatory Visit: Payer: Self-pay

## 2021-03-03 ENCOUNTER — Emergency Department (HOSPITAL_COMMUNITY)
Admission: EM | Admit: 2021-03-03 | Discharge: 2021-03-03 | Disposition: A | Discharge status: Home / Self Care | Payer: 59 | Attending: Emergency Medicine | Admitting: Emergency Medicine

## 2021-03-03 ENCOUNTER — Encounter (HOSPITAL_COMMUNITY): Payer: Self-pay | Admitting: Emergency Medicine

## 2021-03-03 DIAGNOSIS — R1030 Lower abdominal pain, unspecified: Secondary | ICD-10-CM | POA: Insufficient documentation

## 2021-03-03 DIAGNOSIS — R339 Retention of urine, unspecified: Secondary | ICD-10-CM | POA: Diagnosis present

## 2021-03-03 DIAGNOSIS — R350 Frequency of micturition: Secondary | ICD-10-CM | POA: Insufficient documentation

## 2021-03-03 LAB — URINALYSIS, ROUTINE W REFLEX MICROSCOPIC
Bilirubin Urine: NEGATIVE
Glucose, UA: NEGATIVE mg/dL
Hgb urine dipstick: NEGATIVE
Ketones, ur: 5 mg/dL — AB
Leukocytes,Ua: NEGATIVE
Nitrite: NEGATIVE
Protein, ur: NEGATIVE mg/dL
Specific Gravity, Urine: 1.011 (ref 1.005–1.030)
pH: 7 (ref 5.0–8.0)

## 2021-03-03 LAB — CBC WITH DIFFERENTIAL/PLATELET
Abs Immature Granulocytes: 0.02 10*3/uL (ref 0.00–0.07)
Basophils Absolute: 0 10*3/uL (ref 0.0–0.1)
Basophils Relative: 0 %
Eosinophils Absolute: 0 10*3/uL (ref 0.0–0.5)
Eosinophils Relative: 0 %
HCT: 44 % (ref 36.0–46.0)
Hemoglobin: 14.6 g/dL (ref 12.0–15.0)
Immature Granulocytes: 0 %
Lymphocytes Relative: 10 %
Lymphs Abs: 1 10*3/uL (ref 0.7–4.0)
MCH: 31.4 pg (ref 26.0–34.0)
MCHC: 33.2 g/dL (ref 30.0–36.0)
MCV: 94.6 fL (ref 80.0–100.0)
Monocytes Absolute: 0.7 10*3/uL (ref 0.1–1.0)
Monocytes Relative: 7 %
Neutro Abs: 8.6 10*3/uL — ABNORMAL HIGH (ref 1.7–7.7)
Neutrophils Relative %: 83 %
Platelets: 223 10*3/uL (ref 150–400)
RBC: 4.65 MIL/uL (ref 3.87–5.11)
RDW: 12.8 % (ref 11.5–15.5)
WBC: 10.3 10*3/uL (ref 4.0–10.5)
nRBC: 0 % (ref 0.0–0.2)

## 2021-03-03 LAB — BASIC METABOLIC PANEL
Anion gap: 7 (ref 5–15)
BUN: 18 mg/dL (ref 8–23)
CO2: 26 mmol/L (ref 22–32)
Calcium: 9.3 mg/dL (ref 8.9–10.3)
Chloride: 103 mmol/L (ref 98–111)
Creatinine, Ser: 0.83 mg/dL (ref 0.44–1.00)
GFR, Estimated: >60 mL/min (ref >60)
Glucose, Bld: 114 mg/dL — ABNORMAL HIGH (ref 70–99)
Potassium: 3.8 mmol/L (ref 3.5–5.1)
Sodium: 136 mmol/L (ref 135–145)

## 2021-03-03 NOTE — ED Provider Notes (Signed)
Emergency Medicine Provider Triage Evaluation Note  Debbie Decker , a 61 y.o. female  was evaluated in triage.  Pt complains of retention.  Last urination 12 midnight.  Recently 3 days postop from rectal polyp removal.  She denies any rectal pain or abdominal pain.  States she is only urinating dribbles.  No hematuria.  Has never had anything like this previously.  No dysuria.  Review of Systems  Positive: Urine retention Negative: Back pain, fever  Physical Exam  BP (!) 149/94   Pulse 84   Temp 98.3 F (36.8 C) (Oral)   Resp 16   SpO2 95%  Gen:   Awake, no distress   Resp:  Normal effort  MSK:   Moves extremities without difficulty  Other:    Medical Decision Making  Medically screening exam initiated at 4:33 PM.  Appropriate orders placed.  Samar Venneman was informed that the remainder of the evaluation will be completed by another provider, this initial triage assessment does not replace that evaluation, and the importance of remaining in the ED until their evaluation is complete.  Urine retention   Derian Dimalanta A, PA-C 03/03/21 1634    Derwood Kaplan, MD 03/03/21 1755

## 2021-03-03 NOTE — ED Provider Notes (Signed)
[facility]-EMERGENCY DEPT Provider Note   CSN: [id number] Arrival date & time: 03/03/21  1554     History Chief Complaint  Patient presents with   Urinary Retention    Debbie Decker is a 60 y.o. female.  59 year old female with prior medical history as detailed below presents for evaluation.  Patient reports surgery for anal lesion 3 days prior.  Patient reports that she was unable to urinate since midnight last night.  Patient complains of suprapubic discomfort.  Using ibuprofen and Tylenol at home for pain control.  Patient does report that she was getting Exparel for perianal block.  She denies fever.  She denies nausea or vomiting.  She denies abdominal pain after placement of foley catheter.  The history is provided by the patient.  Urinary Frequency This is a new problem. The current episode started yesterday. The problem occurs rarely. The problem has not changed since onset.Pertinent negatives include no chest pain and no abdominal pain. Nothing aggravates the symptoms. Nothing relieves the symptoms.      Past Medical History:  Diagnosis Date   Anal polyp 12/17/2020   Hyperlipidemia    Osteopenia 08/2018   T score -1.9 max 4.6% / 0.6%   Prolapsed internal hemorrhoids 12/17/2020    There are no problems to display for this patient.   Past Surgical History:  Procedure Laterality Date   COLONOSCOPY  2012   COLONOSCOPY  02/06/2021   [facility], 1 benign colon polyp   RECTAL EXAM UNDER ANESTHESIA N/A 02/28/2021   Procedure: ANORECTAL EXAM UNDER ANESTHESIA;  Surgeon: Andria Meuse, MD;  Location: [facility];  Service: General;  Laterality: N/A;   TUMOR EXCISION N/A 02/28/2021   Procedure: EXCISION OF ANAL CANAL POLYPOID TISSUE (TRANSANAL);  Surgeon: Andria Meuse, MD;  Location: [facility];  Service: General;  Laterality: N/A;     OB History     Gravida  4   Para  1   Term  1    Preterm      AB  2   Living  1      SAB  1   IAB      Ectopic      Multiple      Live Births  1           Family History  Problem Relation Age of Onset   Hypertension Mother    Lung cancer Mother    Lung cancer Father     Social History   Tobacco Use   Smoking status: Never   Smokeless tobacco: Never  Vaping Use   Vaping Use: Never used  Substance Use Topics   Alcohol use: No    Alcohol/week: 0.0 standard drinks   Drug use: No    Home Medications Prior to Admission medications   Medication Sig Start Date End Date Taking? Authorizing Provider  calcium carbonate (OS-CAL - DOSED IN MG OF ELEMENTAL CALCIUM) 1250 (500 Ca) MG tablet Take 1 tablet by mouth. Pt takes 900 mg. 1 1/2 600 mg tablets.    [provider]  Cholecalciferol (VITAMIN D3) 125 MCG (5000 UT) CAPS Take by mouth.    [provider]  traMADol (ULTRAM) 50 MG tablet Take 1 tablet (50 mg total) by mouth every 6 (six) hours as needed for up to 5 days (postop pain not controlled with tylenol and ibuprofen first). 02/28/21 03/05/21  Andria Meuse, MD  vitamin C (ASCORBIC ACID) 500 MG tablet  Take 500 mg by mouth daily.    [provider]    Allergies    Penicillins  Review of Systems   Review of Systems  Cardiovascular:  Negative for chest pain.  Gastrointestinal:  Negative for abdominal pain.  Genitourinary:  Positive for frequency.  All other systems reviewed and are negative.  Physical Exam Updated Vital Signs BP (!) 143/67 (BP Location: Left Arm)   Pulse 76   Temp 98.3 F (36.8 C) (Oral)   Resp 16   Ht 5' (1.524 m)   Wt 48 kg   SpO2 98%   BMI 20.67 kg/m   Physical Exam Vitals and nursing note reviewed.  Constitutional:      General: She is not in acute distress.    Appearance: Normal appearance. She is well-developed.  HENT:     Head: Normocephalic and atraumatic.  Eyes:     Conjunctiva/sclera: Conjunctivae normal.     Pupils: Pupils are equal,  round, and reactive to light.  Cardiovascular:     Rate and Rhythm: Normal rate and regular rhythm.     Heart sounds: Normal heart sounds.  Pulmonary:     Effort: Pulmonary effort is normal. No respiratory distress.     Breath sounds: Normal breath sounds.  Abdominal:     General: There is no distension.     Palpations: Abdomen is soft.     Tenderness: There is no abdominal tenderness.     Comments: Mild suprapubic tenderness noted over distended bladder on exam.  Musculoskeletal:        General: No deformity. Normal range of motion.     Cervical back: Normal range of motion and neck supple.  Skin:    General: Skin is warm and dry.  Neurological:     General: No focal deficit present.     Mental Status: She is alert and oriented to person, place, and time.    ED Results / Procedures / Treatments   Labs (all labs ordered are listed, but only abnormal results are displayed) Labs Reviewed  CBC WITH DIFFERENTIAL/PLATELET - Abnormal; Notable for the following components:      Result Value   Neutro Abs 8.6 (*)    All other components within normal limits  BASIC METABOLIC PANEL - Abnormal; Notable for the following components:   Glucose, Bld 114 (*)    All other components within normal limits  URINALYSIS, ROUTINE W REFLEX MICROSCOPIC - Abnormal; Notable for the following components:   Ketones, ur 5 (*)    All other components within normal limits    EKG None  Radiology No results found.  Procedures Procedures   Medications Ordered in ED Medications - No data to display  ED Course  I have reviewed the triage vital signs and the nursing notes.  Pertinent labs & imaging results that were available during my care of the patient were reviewed by me and considered in my medical decision making (see chart for details).    MDM Rules/Calculators/A&P                           MDM  MSE complete  Debbie Decker was evaluated in Emergency Department on 03/03/2021 for the  symptoms described in the history of present illness. She was evaluated in the context of the global COVID-19 pandemic, which necessitated consideration that the patient might be at risk for infection with the SARS-CoV-2 virus that causes COVID-19. Institutional protocols and algorithms that  pertain to the evaluation of patients at risk for COVID-19 are in a state of rapid change based on information released by regulatory bodies including the CDC and federal and state organizations. These policies and algorithms were followed during the patient's care in the ED.  Presented with complaint of urinary retention.  Patient is status post recent surgery on rectum.  Patient was given perianal block at time of operation.  Patient given Foley catheter here in the ED.  Patient produced 700 cc of urine immediately.  She felt significantly improved with drainage.  Screening labs obtained are without significant abnormality.  Case discussed with Dr. Donell Beers general surgery.  She suspects that patient's symptoms are related to use of Exparel in perianal block.  Patient comfortable after Foley placement.  Patient is to be discharged.  General surgery clinic will contact the patient tomorrow to arrange outpatient follow-up.  Importance of close follow-up stressed to the patient.  Strict return precautions given and understood.   Final Clinical Impression(s) / ED Diagnoses Final diagnoses:  Urinary retention    Rx / DC Orders ED Discharge Orders     None        Wynetta Fines, MD 03/03/21 1851

## 2021-03-03 NOTE — ED Triage Notes (Signed)
PT c/o decreased urinary output since last night. States 3 days post op anal polyp removal.

## 2021-03-03 NOTE — Discharge Instructions (Signed)
Return for any problem.  You should be contacted by the general surgery clinic tomorrow to establish close follow-up in the outpatient setting.  Return for increased abdominal pain, fever, or other emergency.
